# Patient Record
Sex: Female | Born: 1985
Health system: Southern US, Community
[De-identification: ages and names within clinical notes are randomized; demographics above are authoritative.]

## PROBLEM LIST (undated history)

## (undated) DIAGNOSIS — R87629 Unspecified abnormal cytological findings in specimens from vagina: Secondary | ICD-10-CM

## (undated) DIAGNOSIS — M2560 Stiffness of unspecified joint, not elsewhere classified: Secondary | ICD-10-CM

## (undated) DIAGNOSIS — Z86018 Personal history of other benign neoplasm: Secondary | ICD-10-CM

## (undated) DIAGNOSIS — R112 Nausea with vomiting, unspecified: Secondary | ICD-10-CM

## (undated) DIAGNOSIS — Z9889 Other specified postprocedural states: Secondary | ICD-10-CM

## (undated) DIAGNOSIS — M791 Myalgia, unspecified site: Secondary | ICD-10-CM

## (undated) DIAGNOSIS — R202 Paresthesia of skin: Secondary | ICD-10-CM

## (undated) DIAGNOSIS — K219 Gastro-esophageal reflux disease without esophagitis: Secondary | ICD-10-CM

## (undated) DIAGNOSIS — N200 Calculus of kidney: Secondary | ICD-10-CM

## (undated) HISTORY — DX: Personal history of other benign neoplasm: Z86.018

## (undated) HISTORY — DX: Paresthesia of skin: R20.2

## (undated) HISTORY — PX: LEEP: SHX91

## (undated) HISTORY — PX: WISDOM TOOTH EXTRACTION: SHX21

## (undated) HISTORY — DX: Stiffness of unspecified joint, not elsewhere classified: M25.60

## (undated) HISTORY — DX: Calculus of kidney: N20.0

## (undated) HISTORY — DX: Gastro-esophageal reflux disease without esophagitis: K21.9

## (undated) HISTORY — DX: Myalgia, unspecified site: M79.10

---

## 2007-05-09 ENCOUNTER — Encounter: Admission: RE | Admit: 2007-05-09 | Discharge: 2007-05-09 | Payer: Self-pay | Admitting: Internal Medicine

## 2008-09-07 ENCOUNTER — Emergency Department (HOSPITAL_COMMUNITY): Admission: EM | Admit: 2008-09-07 | Discharge: 2008-09-07 | Payer: Self-pay | Admitting: Emergency Medicine

## 2010-07-27 LAB — COMPREHENSIVE METABOLIC PANEL
ALT: 16 U/L (ref 0–35)
AST: 22 U/L (ref 0–37)
Albumin: 3.8 g/dL (ref 3.5–5.2)
CO2: 25 mEq/L (ref 19–32)
Calcium: 8.7 mg/dL (ref 8.4–10.5)
GFR calc Af Amer: >60 mL/min (ref >60)
Sodium: 137 mEq/L (ref 135–145)
Total Protein: 7.4 g/dL (ref 6.0–8.3)

## 2010-07-27 LAB — DIFFERENTIAL
Eosinophils Absolute: 0.2 10*3/uL (ref 0.0–0.7)
Eosinophils Relative: 1 % (ref 0–5)
Lymphocytes Relative: 8 % — ABNORMAL LOW (ref 12–46)
Lymphs Abs: 0.9 10*3/uL (ref 0.7–4.0)
Monocytes Absolute: 0.6 10*3/uL (ref 0.1–1.0)
Monocytes Relative: 5 % (ref 3–12)

## 2010-07-27 LAB — URINALYSIS, ROUTINE W REFLEX MICROSCOPIC
Ketones, ur: NEGATIVE mg/dL
Nitrite: NEGATIVE
Specific Gravity, Urine: 1.025 (ref 1.005–1.030)
Urobilinogen, UA: 0.2 mg/dL (ref 0.0–1.0)
pH: 5 (ref 5.0–8.0)

## 2010-07-27 LAB — CBC
MCHC: 34.2 g/dL (ref 30.0–36.0)
Platelets: 217 10*3/uL (ref 150–400)
RBC: 4.31 MIL/uL (ref 3.87–5.11)
RDW: 13.6 % (ref 11.5–15.5)

## 2016-07-18 DIAGNOSIS — Z30431 Encounter for routine checking of intrauterine contraceptive device: Secondary | ICD-10-CM | POA: Diagnosis not present

## 2016-07-18 DIAGNOSIS — N938 Other specified abnormal uterine and vaginal bleeding: Secondary | ICD-10-CM | POA: Diagnosis not present

## 2016-11-14 DIAGNOSIS — K080 Exfoliation of teeth due to systemic causes: Secondary | ICD-10-CM | POA: Diagnosis not present

## 2017-02-15 ENCOUNTER — Encounter: Payer: Self-pay | Admitting: Family Medicine

## 2017-02-27 DIAGNOSIS — Z30432 Encounter for removal of intrauterine contraceptive device: Secondary | ICD-10-CM | POA: Diagnosis not present

## 2017-03-05 DIAGNOSIS — R300 Dysuria: Secondary | ICD-10-CM | POA: Diagnosis not present

## 2017-03-05 DIAGNOSIS — N202 Calculus of kidney with calculus of ureter: Secondary | ICD-10-CM | POA: Diagnosis not present

## 2017-03-05 DIAGNOSIS — N390 Urinary tract infection, site not specified: Secondary | ICD-10-CM | POA: Diagnosis not present

## 2017-03-15 DIAGNOSIS — N1330 Unspecified hydronephrosis: Secondary | ICD-10-CM | POA: Diagnosis not present

## 2017-03-15 DIAGNOSIS — N200 Calculus of kidney: Secondary | ICD-10-CM | POA: Diagnosis not present

## 2017-03-15 DIAGNOSIS — R310 Gross hematuria: Secondary | ICD-10-CM | POA: Diagnosis not present

## 2017-03-15 DIAGNOSIS — R300 Dysuria: Secondary | ICD-10-CM | POA: Diagnosis not present

## 2017-03-22 DIAGNOSIS — J029 Acute pharyngitis, unspecified: Secondary | ICD-10-CM | POA: Diagnosis not present

## 2017-03-22 DIAGNOSIS — J020 Streptococcal pharyngitis: Secondary | ICD-10-CM | POA: Diagnosis not present

## 2017-03-29 DIAGNOSIS — N1330 Unspecified hydronephrosis: Secondary | ICD-10-CM | POA: Diagnosis not present

## 2017-03-29 DIAGNOSIS — N200 Calculus of kidney: Secondary | ICD-10-CM | POA: Diagnosis not present

## 2017-03-29 DIAGNOSIS — R3129 Other microscopic hematuria: Secondary | ICD-10-CM | POA: Diagnosis not present

## 2017-04-27 DIAGNOSIS — M2560 Stiffness of unspecified joint, not elsewhere classified: Secondary | ICD-10-CM | POA: Diagnosis not present

## 2017-04-27 DIAGNOSIS — N200 Calculus of kidney: Secondary | ICD-10-CM

## 2017-04-27 DIAGNOSIS — Z86018 Personal history of other benign neoplasm: Secondary | ICD-10-CM

## 2017-04-27 DIAGNOSIS — K219 Gastro-esophageal reflux disease without esophagitis: Secondary | ICD-10-CM

## 2017-04-27 DIAGNOSIS — R202 Paresthesia of skin: Secondary | ICD-10-CM

## 2017-04-27 DIAGNOSIS — M791 Myalgia, unspecified site: Secondary | ICD-10-CM | POA: Diagnosis not present

## 2017-04-27 HISTORY — DX: Gastro-esophageal reflux disease without esophagitis: K21.9

## 2017-04-27 HISTORY — DX: Myalgia, unspecified site: M79.10

## 2017-04-27 HISTORY — DX: Calculus of kidney: N20.0

## 2017-04-27 HISTORY — DX: Personal history of other benign neoplasm: Z86.018

## 2017-04-27 HISTORY — DX: Stiffness of unspecified joint, not elsewhere classified: M25.60

## 2017-04-27 HISTORY — DX: Paresthesia of skin: R20.2

## 2017-04-28 ENCOUNTER — Ambulatory Visit: Payer: Self-pay

## 2017-04-28 DIAGNOSIS — N3090 Cystitis, unspecified without hematuria: Secondary | ICD-10-CM | POA: Diagnosis not present

## 2017-05-01 ENCOUNTER — Other Ambulatory Visit: Payer: Self-pay

## 2017-05-02 ENCOUNTER — Ambulatory Visit: Payer: Self-pay | Admitting: Urology

## 2017-05-09 DIAGNOSIS — R319 Hematuria, unspecified: Secondary | ICD-10-CM | POA: Diagnosis not present

## 2017-06-01 DIAGNOSIS — Z6823 Body mass index (BMI) 23.0-23.9, adult: Secondary | ICD-10-CM | POA: Diagnosis not present

## 2017-06-01 DIAGNOSIS — Z118 Encounter for screening for other infectious and parasitic diseases: Secondary | ICD-10-CM | POA: Diagnosis not present

## 2017-06-01 DIAGNOSIS — Z113 Encounter for screening for infections with a predominantly sexual mode of transmission: Secondary | ICD-10-CM | POA: Diagnosis not present

## 2017-06-01 DIAGNOSIS — Z01419 Encounter for gynecological examination (general) (routine) without abnormal findings: Secondary | ICD-10-CM | POA: Diagnosis not present

## 2017-06-06 DIAGNOSIS — K080 Exfoliation of teeth due to systemic causes: Secondary | ICD-10-CM | POA: Diagnosis not present

## 2017-06-07 ENCOUNTER — Encounter: Payer: Self-pay | Admitting: Urology

## 2017-06-07 ENCOUNTER — Ambulatory Visit (INDEPENDENT_AMBULATORY_CARE_PROVIDER_SITE_OTHER): Payer: Federal, State, Local not specified - PPO | Admitting: Urology

## 2017-06-07 VITALS — BP 100/65 | HR 69 | Ht 62.0 in | Wt 126.0 lb

## 2017-06-07 DIAGNOSIS — R3129 Other microscopic hematuria: Secondary | ICD-10-CM | POA: Diagnosis not present

## 2017-06-07 DIAGNOSIS — Z87442 Personal history of urinary calculi: Secondary | ICD-10-CM | POA: Diagnosis not present

## 2017-06-07 DIAGNOSIS — N390 Urinary tract infection, site not specified: Secondary | ICD-10-CM | POA: Diagnosis not present

## 2017-06-07 LAB — URINALYSIS, COMPLETE
Bilirubin, UA: NEGATIVE
Glucose, UA: NEGATIVE
Ketones, UA: NEGATIVE
Leukocytes, UA: NEGATIVE
NITRITE UA: NEGATIVE
PH UA: 5.5 (ref 5.0–7.5)
PROTEIN UA: NEGATIVE
Specific Gravity, UA: 1.025 (ref 1.005–1.030)
UUROB: 0.2 mg/dL (ref 0.2–1.0)

## 2017-06-07 LAB — MICROSCOPIC EXAMINATION: WBC UA: NONE SEEN /HPF (ref 0–?)

## 2017-06-07 NOTE — Progress Notes (Signed)
06/07/2017 1:57 PM   Nicole Mcgrath 25-Nov-1985 [id number]  Referring provider: Ace Gins, MD [address] [place], [place] [address]  Chief Complaint  Patient presents with  . Hematuria    New Patient  . Recurrent UTI    HPI: 32 year old female with recurrent urinary tract infections, microscopic hematuria who presents today to establish care.  She moved to this area about 6 months ago from [place] where she was previously followed by a urologist, Ace Gins.  She has been commuting back and forth but now is seeking to have care locally.  She is a personal history of nephrolithiasis.  She had an episode of flank pain resulting in a noncontrast CT scan on 03/15/2017 at [facility].  This showed mildly thickened bladder wall at 6 mm but no evidence of hydronephrosis or obstructing stones bilaterally.   Previous stone analysis revealed calcium oxalate monohydrate 98%, calcium phosphate 2%.  She does also a personal history of microscopic hematuria.  A CT urogram as well as cystoscopy was ordered by her previous urologist.  She is a never smoker and has no chemical exposures.  She is unsure whether she was on her menstrual cycle at the time of previous urinalysis.  She has a personal history of urinary tract infections, primarily during pregnancy.  She is not currently pregnant.  Today, she is currently asymptomatic.  She has no flank pain.  No dysuria or urinary symptoms.   PMH: Past Medical History:  Diagnosis Date  . Gastroesophageal reflux disease 04/27/2017  . History of atypical skin mole 04/27/2017  . Kidney stones 04/27/2017  . Myalgia 04/27/2017  . Paresthesia 04/27/2017  . Stiffness of joint 04/27/2017    Surgical History: Past Surgical History:  Procedure Laterality Date  . CESAREAN SECTION  2017    Home Medications:  Allergies as of 06/07/2017   No Known Allergies     Medication List        Accurate as of 06/07/17 11:59 PM. Always use your most recent med  list.          prenatal vitamin w/FE, FA 27-1 MG Tabs tablet Take 1 tablet by mouth daily at 12 noon.       Allergies: No Known Allergies  Family History: Family History  Problem Relation Age of Onset  . Prostate cancer Neg Hx   . Bladder Cancer Neg Hx   . Kidney cancer Neg Hx     Social History:  reports that she has never smoked. She has never used smokeless tobacco. She reports that she drinks alcohol. She reports that she does not use drugs.  ROS: UROLOGY Frequent Urination?: Yes Hard to postpone urination?: Yes Burning/pain with urination?: Yes Get up at night to urinate?: No Leakage of urine?: No Urine stream starts and stops?: No Trouble starting stream?: No Do you have to strain to urinate?: No Blood in urine?: Yes Urinary tract infection?: Yes Sexually transmitted disease?: No Injury to kidneys or bladder?: No Painful intercourse?: No Weak stream?: No Currently pregnant?: No Vaginal bleeding?: No Last menstrual period?: n  Gastrointestinal Nausea?: No Vomiting?: No Indigestion/heartburn?: Yes Diarrhea?: No Constipation?: No  Constitutional Fever: No Night sweats?: No Weight loss?: No Fatigue?: No  Skin Skin rash/lesions?: No Itching?: No  Eyes Blurred vision?: No Double vision?: No  Ears/Nose/Throat Sore throat?: No Sinus problems?: No  Hematologic/Lymphatic Swollen glands?: No Easy bruising?: No  Cardiovascular Leg swelling?: No Chest pain?: No  Respiratory Cough?: No Shortness of breath?: No  Endocrine  Excessive thirst?: No  Musculoskeletal Back pain?: No Joint pain?: No  Neurological Headaches?: No Dizziness?: Yes  Psychologic Depression?: No Anxiety?: No  Physical Exam: BP 100/65   Pulse 69   Ht 5\' 2"  (1.575 m)   Wt 126 lb (57.2 kg)   LMP 05/16/2017 (Approximate)   BMI 23.05 kg/m   Constitutional:  Alert and oriented, No acute distress. HEENT: [place] AT, moist mucus membranes.  Trachea midline, no  masses. Cardiovascular: No clubbing, cyanosis, or edema. Respiratory: Normal respiratory effort, no increased work of breathing. GI: Abdomen is soft, nontender, nondistended, no abdominal masses GU: No CVA tenderness.  Skin: No rashes, bruises or suspicious lesions. Neurologic: Grossly intact, no focal deficits, moving all 4 extremities. Psychiatric: Normal mood and affect.  Laboratory Data: Lab Results  Component Value Date   WBC 11.4 (H) 09/07/2008   HGB 13.5 09/07/2008   HCT 39.5 09/07/2008   MCV 91.7 09/07/2008   PLT 217 09/07/2008    Lab Results  Component Value Date   CREATININE 0.79 09/07/2008    Urinalysis Lab Results  Component Value Date   SPECGRAV 1.025 06/07/2017   PHUR 5.5 06/07/2017   COLORU Yellow 06/07/2017   APPEARANCEUR Clear 06/07/2017   LEUKOCYTESUR Negative 06/07/2017   PROTEINUR Negative 06/07/2017   GLUCOSEU Negative 06/07/2017   KETONESU Negative 06/07/2017   RBCU 3+ (A) 06/07/2017   BILIRUBINUR Negative 06/07/2017   UUROB 0.2 06/07/2017   NITRITE Negative 06/07/2017    Lab Results  Component Value Date   LABMICR See below: 06/07/2017   WBCUA None seen 06/07/2017   RBCUA 3-10 (A) 06/07/2017   LABEPIT 0-10 06/07/2017   MUCUS Present (A) 06/07/2017   BACTERIA Few (A) 06/07/2017    Pertinent Imaging: Unable to personally review previous imaging, reports reviewed from CT scan from 03/07/2017 at which time she was stone free  Assessment & Plan:    1. Microscopic hematuria We discussed the differential diagnosis for microscopic hematuria including nephrolithiasis, renal or upper tract tumors, bladder stones, UTIs, or bladder tumors as well as undetermined etiologies.  Per AUA guidelines, patients under the age of 32 without risk factors could be considered but do not necessarily need to undergo cystoscopy.  We discussed the risk and benefits of whether or not to proceed with this.  She does have microscopic blood in her urine today.  We  will consider cystoscopy in the future if this persists.   - Urinalysis, Complete  2. History of kidney stones Discussed stone prevention techniques in detail Currently stone free as of 02/2017 on cross-sectional imaging KUB at next visit for baseline - DG Abd 1 View; Future  3. Recurrent UTI Review of signs and symptoms of urinary tract infections  If she does develop signs or symptoms of UTI, advised to call our office for same-day nurse visit to evaluate UA/urine culture - CULTURE, URINE COMPREHENSIVE   Return in about 3 months (around 09/04/2017) for UA/ KUB.  Vanna ScotlandAshley Pepper Wyndham, MD  [facility] [address], [address] [place], [place] [id number] [phone]

## 2017-06-09 LAB — CULTURE, URINE COMPREHENSIVE

## 2017-06-13 ENCOUNTER — Telehealth: Payer: Self-pay

## 2017-06-13 NOTE — Telephone Encounter (Signed)
Letter sent.

## 2017-06-13 NOTE — Telephone Encounter (Signed)
-----   Message from Vanna ScotlandAshley Brandon, MD sent at 06/09/2017  5:19 PM EST ----- Please let this patient know that we did send her urine for culture again today and it was negative.

## 2017-07-17 ENCOUNTER — Encounter: Payer: Self-pay | Admitting: Urology

## 2017-07-19 DIAGNOSIS — D2272 Melanocytic nevi of left lower limb, including hip: Secondary | ICD-10-CM | POA: Diagnosis not present

## 2017-07-19 DIAGNOSIS — L718 Other rosacea: Secondary | ICD-10-CM | POA: Diagnosis not present

## 2017-07-19 DIAGNOSIS — D2261 Melanocytic nevi of right upper limb, including shoulder: Secondary | ICD-10-CM | POA: Diagnosis not present

## 2017-07-19 DIAGNOSIS — D2262 Melanocytic nevi of left upper limb, including shoulder: Secondary | ICD-10-CM | POA: Diagnosis not present

## 2017-07-26 DIAGNOSIS — R309 Painful micturition, unspecified: Secondary | ICD-10-CM | POA: Diagnosis not present

## 2017-07-26 DIAGNOSIS — N390 Urinary tract infection, site not specified: Secondary | ICD-10-CM | POA: Diagnosis not present

## 2017-07-26 DIAGNOSIS — R319 Hematuria, unspecified: Secondary | ICD-10-CM | POA: Diagnosis not present

## 2017-09-12 ENCOUNTER — Telehealth: Payer: Self-pay | Admitting: Urology

## 2017-09-12 NOTE — Telephone Encounter (Signed)
Pt called office stating she has a 3 mos follow up appt with UA and KUB prior, pt states she is on her cycle and wonders if the appt should be rescheduled due to menstration interfering w/ results of UA. Please advise. Thanks.

## 2017-09-13 ENCOUNTER — Ambulatory Visit
Admission: RE | Admit: 2017-09-13 | Discharge: 2017-09-13 | Disposition: A | Discharge status: Home / Self Care | Payer: Federal, State, Local not specified - PPO | Source: Ambulatory Visit | Attending: Urology | Admitting: Urology

## 2017-09-13 ENCOUNTER — Ambulatory Visit: Payer: Federal, State, Local not specified - PPO | Admitting: Urology

## 2017-09-13 DIAGNOSIS — R3129 Other microscopic hematuria: Secondary | ICD-10-CM

## 2017-09-13 DIAGNOSIS — N200 Calculus of kidney: Secondary | ICD-10-CM | POA: Diagnosis not present

## 2017-09-14 ENCOUNTER — Telehealth: Payer: Self-pay

## 2017-09-14 NOTE — Telephone Encounter (Signed)
-----   Message from Vanna Scotland, MD sent at 09/13/2017  4:41 PM EDT ----- No obvious stones today on x-ray.  I will be on the look out for your next week to assess whether or not you still blood in your urine in order to make a decision whether or not we need to proceed with cystoscopy.

## 2017-09-14 NOTE — Telephone Encounter (Signed)
Called pt, informed her of the information below. Pt gave verbal understanding.  

## 2017-09-19 ENCOUNTER — Other Ambulatory Visit: Payer: Self-pay | Admitting: Family Medicine

## 2017-09-19 DIAGNOSIS — R3129 Other microscopic hematuria: Secondary | ICD-10-CM

## 2017-09-20 ENCOUNTER — Other Ambulatory Visit: Payer: Federal, State, Local not specified - PPO

## 2017-09-20 DIAGNOSIS — R3129 Other microscopic hematuria: Secondary | ICD-10-CM

## 2017-09-20 LAB — URINALYSIS, COMPLETE
Bilirubin, UA: NEGATIVE
GLUCOSE, UA: NEGATIVE
Ketones, UA: NEGATIVE
Leukocytes, UA: NEGATIVE
Nitrite, UA: NEGATIVE
PH UA: 6 (ref 5.0–7.5)
PROTEIN UA: NEGATIVE
Specific Gravity, UA: 1.015 (ref 1.005–1.030)
Urobilinogen, Ur: 0.2 mg/dL (ref 0.2–1.0)

## 2017-09-20 LAB — MICROSCOPIC EXAMINATION: WBC, UA: NONE SEEN /hpf (ref 0–5)

## 2017-09-22 ENCOUNTER — Telehealth: Payer: Self-pay

## 2017-09-22 NOTE — Telephone Encounter (Signed)
-----   Message from Vanna ScotlandAshley Brandon, MD sent at 09/21/2017 10:49 AM EDT ----- Microscopic blood is still present in your urine.  Honestly, it is probably not a bad idea to have an office cystoscopy although I doubt we will find anything.  Please go ahead and schedule this if you are okay with this plan.    Vanna ScotlandAshley Brandon, MD

## 2017-10-04 ENCOUNTER — Ambulatory Visit: Payer: Federal, State, Local not specified - PPO | Admitting: Urology

## 2017-10-04 ENCOUNTER — Encounter: Payer: Self-pay | Admitting: Urology

## 2017-10-04 VITALS — BP 112/71 | HR 60 | Resp 16 | Ht 62.0 in | Wt 127.2 lb

## 2017-10-04 DIAGNOSIS — R3129 Other microscopic hematuria: Secondary | ICD-10-CM

## 2017-10-04 DIAGNOSIS — Z87442 Personal history of urinary calculi: Secondary | ICD-10-CM

## 2017-10-04 LAB — URINALYSIS, COMPLETE
BILIRUBIN UA: NEGATIVE
Glucose, UA: NEGATIVE
Ketones, UA: NEGATIVE
Leukocytes, UA: NEGATIVE
NITRITE UA: NEGATIVE
Protein, UA: NEGATIVE
Specific Gravity, UA: <1.005 — ABNORMAL LOW (ref 1.005–1.030)
Urobilinogen, Ur: 0.2 mg/dL (ref 0.2–1.0)
pH, UA: 6 (ref 5.0–7.5)

## 2017-10-04 LAB — MICROSCOPIC EXAMINATION
Bacteria, UA: NONE SEEN
WBC, UA: NONE SEEN /hpf (ref 0–5)

## 2017-10-04 MED ORDER — CIPROFLOXACIN HCL 500 MG PO TABS
500.0000 mg | ORAL_TABLET | Freq: Once | ORAL | Status: AC
  Administered 2017-10-04: 500 mg via ORAL

## 2017-10-04 MED ORDER — LIDOCAINE HCL URETHRAL/MUCOSAL 2 % EX GEL
1.0000 "application " | Freq: Once | CUTANEOUS | Status: AC
  Administered 2017-10-04: 1 via URETHRAL

## 2017-10-04 NOTE — Progress Notes (Signed)
   10/04/17  CC:  Chief Complaint  Patient presents with  . Cysto    HPI: 32 year old female with a history of recurrent urinary tract infections, history of kidney stones, and microscopic hematuria who presents today for office cystoscopy.  Most recent imaging in the form of KUB shows no obvious stone burden.  Most recent cross-sectional imaging in the form of CT scan was 02/2017 with a noncontrast scan for stones.  See previous note for details.  Blood pressure 112/71, pulse 60, resp. rate 16, height 5\' 2"  (1.575 m), weight 127 lb 3.2 oz (57.7 kg), SpO2 96 %. NED. A&Ox3.   No respiratory distress   Abd soft, NT, ND Normal external genitalia with patent urethral meatus  Cystoscopy Procedure Note  Patient identification was confirmed, informed consent was obtained, and patient was prepped using Betadine solution.  Lidocaine jelly was administered per urethral meatus.    Preoperative abx where received prior to procedure.    Procedure: - Flexible cystoscope introduced, without any difficulty.   - Thorough search of the bladder revealed:    normal urethral meatus    normal urothelium    no stones    no ulcers     no tumors    no urethral polyps    no trabeculation  - Ureteral orifices were normal in position and appearance.  Post-Procedure: - Patient tolerated the procedure well  Assessment/ Plan:  1. Microscopic hematuria Possibly related to stones Given her age and very low risk, no need for additional CT urogram at this point in time Recent noncontrast CT scan shows no obvious renal abnormalities or masses Cystoscopy today is unremarkable - Urinalysis, Complete - ciprofloxacin (CIPRO) tablet 500 mg - lidocaine (XYLOCAINE) 2 % jelly 1 application  2. History of kidney stones Most recent KUB without stones Currently asymptomatic  Follow-up as needed  Vanna ScotlandAshley Aubryana Vittorio, MD

## 2017-12-06 DIAGNOSIS — Z3201 Encounter for pregnancy test, result positive: Secondary | ICD-10-CM | POA: Diagnosis not present

## 2017-12-27 DIAGNOSIS — Z3201 Encounter for pregnancy test, result positive: Secondary | ICD-10-CM | POA: Diagnosis not present

## 2018-01-01 DIAGNOSIS — K080 Exfoliation of teeth due to systemic causes: Secondary | ICD-10-CM | POA: Diagnosis not present

## 2018-01-19 DIAGNOSIS — Z3689 Encounter for other specified antenatal screening: Secondary | ICD-10-CM | POA: Diagnosis not present

## 2018-01-19 DIAGNOSIS — Z01419 Encounter for gynecological examination (general) (routine) without abnormal findings: Secondary | ICD-10-CM | POA: Diagnosis not present

## 2018-01-19 DIAGNOSIS — Z23 Encounter for immunization: Secondary | ICD-10-CM | POA: Diagnosis not present

## 2018-01-19 DIAGNOSIS — O3441 Maternal care for other abnormalities of cervix, first trimester: Secondary | ICD-10-CM | POA: Diagnosis not present

## 2018-01-19 DIAGNOSIS — Z118 Encounter for screening for other infectious and parasitic diseases: Secondary | ICD-10-CM | POA: Diagnosis not present

## 2018-01-19 DIAGNOSIS — Z3481 Encounter for supervision of other normal pregnancy, first trimester: Secondary | ICD-10-CM | POA: Diagnosis not present

## 2018-01-30 DIAGNOSIS — K080 Exfoliation of teeth due to systemic causes: Secondary | ICD-10-CM | POA: Diagnosis not present

## 2018-02-16 DIAGNOSIS — Z3A14 14 weeks gestation of pregnancy: Secondary | ICD-10-CM | POA: Diagnosis not present

## 2018-02-16 DIAGNOSIS — O3441 Maternal care for other abnormalities of cervix, first trimester: Secondary | ICD-10-CM | POA: Diagnosis not present

## 2018-03-02 DIAGNOSIS — Z3A16 16 weeks gestation of pregnancy: Secondary | ICD-10-CM | POA: Diagnosis not present

## 2018-03-02 DIAGNOSIS — O3442 Maternal care for other abnormalities of cervix, second trimester: Secondary | ICD-10-CM | POA: Diagnosis not present

## 2018-03-02 DIAGNOSIS — Z361 Encounter for antenatal screening for raised alphafetoprotein level: Secondary | ICD-10-CM | POA: Diagnosis not present

## 2018-03-19 DIAGNOSIS — Z363 Encounter for antenatal screening for malformations: Secondary | ICD-10-CM | POA: Diagnosis not present

## 2018-03-19 DIAGNOSIS — Z3A18 18 weeks gestation of pregnancy: Secondary | ICD-10-CM | POA: Diagnosis not present

## 2018-03-19 DIAGNOSIS — O3442 Maternal care for other abnormalities of cervix, second trimester: Secondary | ICD-10-CM | POA: Diagnosis not present

## 2018-04-23 DIAGNOSIS — Z3A23 23 weeks gestation of pregnancy: Secondary | ICD-10-CM | POA: Diagnosis not present

## 2018-04-23 DIAGNOSIS — O3442 Maternal care for other abnormalities of cervix, second trimester: Secondary | ICD-10-CM | POA: Diagnosis not present

## 2018-05-14 DIAGNOSIS — O3442 Maternal care for other abnormalities of cervix, second trimester: Secondary | ICD-10-CM | POA: Diagnosis not present

## 2018-05-14 DIAGNOSIS — Z3689 Encounter for other specified antenatal screening: Secondary | ICD-10-CM | POA: Diagnosis not present

## 2018-05-14 DIAGNOSIS — Z3A26 26 weeks gestation of pregnancy: Secondary | ICD-10-CM | POA: Diagnosis not present

## 2018-05-29 DIAGNOSIS — Z3A29 29 weeks gestation of pregnancy: Secondary | ICD-10-CM | POA: Diagnosis not present

## 2018-05-29 DIAGNOSIS — Z23 Encounter for immunization: Secondary | ICD-10-CM | POA: Diagnosis not present

## 2018-05-29 DIAGNOSIS — O3443 Maternal care for other abnormalities of cervix, third trimester: Secondary | ICD-10-CM | POA: Diagnosis not present

## 2018-06-13 DIAGNOSIS — Z3403 Encounter for supervision of normal first pregnancy, third trimester: Secondary | ICD-10-CM | POA: Diagnosis not present

## 2018-06-25 DIAGNOSIS — Z3A32 32 weeks gestation of pregnancy: Secondary | ICD-10-CM | POA: Diagnosis not present

## 2018-06-25 DIAGNOSIS — O3443 Maternal care for other abnormalities of cervix, third trimester: Secondary | ICD-10-CM | POA: Diagnosis not present

## 2018-07-06 ENCOUNTER — Other Ambulatory Visit: Payer: Self-pay | Admitting: Obstetrics

## 2018-07-11 DIAGNOSIS — Z3A35 35 weeks gestation of pregnancy: Secondary | ICD-10-CM | POA: Diagnosis not present

## 2018-07-11 DIAGNOSIS — Z3685 Encounter for antenatal screening for Streptococcus B: Secondary | ICD-10-CM | POA: Diagnosis not present

## 2018-07-11 DIAGNOSIS — O3443 Maternal care for other abnormalities of cervix, third trimester: Secondary | ICD-10-CM | POA: Diagnosis not present

## 2018-07-23 DIAGNOSIS — O3443 Maternal care for other abnormalities of cervix, third trimester: Secondary | ICD-10-CM | POA: Diagnosis not present

## 2018-07-23 DIAGNOSIS — Z3A36 36 weeks gestation of pregnancy: Secondary | ICD-10-CM | POA: Diagnosis not present

## 2018-07-24 ENCOUNTER — Encounter (HOSPITAL_COMMUNITY): Payer: Self-pay | Admitting: *Deleted

## 2018-07-24 NOTE — Patient Instructions (Addendum)
Nicole Mcgrath  07/24/2018   Your procedure is scheduled on:  08/07/2018  Arrive at 0730 at [facility] on [address] at [facility]  and [employer]. You are invited to use the FREE valet parking or use the Visitor's parking deck.  Pick up the phone at the desk and dial [phone].  Call this number if you have problems the morning of surgery: [phone]  Remember:   Do not eat food:(After Midnight) Desps de medianoche.  Do not drink clear liquids: (After Midnight) Desps de medianoche.  Take these medicines the morning of surgery with A SIP OF WATER:  none   Do not wear jewelry, make-up or nail polish.  Do not wear lotions, powders, or perfumes. Do not wear deodorant.  Do not shave 48 hours prior to surgery.  Do not bring valuables to the hospital.  [facility] is not   responsible for any belongings or valuables brought to the hospital.  Contacts, dentures or bridgework may not be worn into surgery.  Leave suitcase in the car. After surgery it may be brought to your room.  For patients admitted to the hospital, checkout time is 11:00 AM the day of              discharge.      Please read over the following fact sheets that you were given:     Preparing for Surgery

## 2018-08-02 DIAGNOSIS — Z3A38 38 weeks gestation of pregnancy: Secondary | ICD-10-CM | POA: Diagnosis not present

## 2018-08-02 DIAGNOSIS — O3443 Maternal care for other abnormalities of cervix, third trimester: Secondary | ICD-10-CM | POA: Diagnosis not present

## 2018-08-06 ENCOUNTER — Encounter (HOSPITAL_COMMUNITY)
Admission: RE | Admit: 2018-08-06 | Discharge: 2018-08-06 | Disposition: A | Discharge status: Home / Self Care | Payer: Federal, State, Local not specified - PPO | Source: Ambulatory Visit

## 2018-08-06 HISTORY — DX: Unspecified abnormal cytological findings in specimens from vagina: R87.629

## 2018-08-06 HISTORY — DX: Other specified postprocedural states: Z98.890

## 2018-08-06 HISTORY — DX: Nausea with vomiting, unspecified: R11.2

## 2018-08-07 ENCOUNTER — Inpatient Hospital Stay (HOSPITAL_COMMUNITY): Payer: Federal, State, Local not specified - PPO | Admitting: Certified Registered Nurse Anesthetist

## 2018-08-07 ENCOUNTER — Encounter (HOSPITAL_COMMUNITY): Payer: Self-pay | Admitting: Anesthesiology

## 2018-08-07 ENCOUNTER — Inpatient Hospital Stay (HOSPITAL_COMMUNITY)
Admission: AD | Admit: 2018-08-07 | Discharge: 2018-08-09 | DRG: 787 | Disposition: A | Discharge status: Home / Self Care | Payer: Federal, State, Local not specified - PPO | Attending: Obstetrics | Admitting: Obstetrics

## 2018-08-07 ENCOUNTER — Encounter (HOSPITAL_COMMUNITY)
Admission: AD | Disposition: A | Discharge status: Home / Self Care | Payer: Self-pay | Source: Home / Self Care | Attending: Obstetrics

## 2018-08-07 DIAGNOSIS — O34219 Maternal care for unspecified type scar from previous cesarean delivery: Secondary | ICD-10-CM | POA: Diagnosis present

## 2018-08-07 DIAGNOSIS — O26893 Other specified pregnancy related conditions, third trimester: Secondary | ICD-10-CM | POA: Diagnosis present

## 2018-08-07 DIAGNOSIS — Z3A39 39 weeks gestation of pregnancy: Secondary | ICD-10-CM

## 2018-08-07 DIAGNOSIS — O34211 Maternal care for low transverse scar from previous cesarean delivery: Secondary | ICD-10-CM | POA: Diagnosis not present

## 2018-08-07 DIAGNOSIS — O9962 Diseases of the digestive system complicating childbirth: Secondary | ICD-10-CM | POA: Diagnosis not present

## 2018-08-07 DIAGNOSIS — O9081 Anemia of the puerperium: Secondary | ICD-10-CM | POA: Diagnosis not present

## 2018-08-07 DIAGNOSIS — K219 Gastro-esophageal reflux disease without esophagitis: Secondary | ICD-10-CM | POA: Diagnosis not present

## 2018-08-07 DIAGNOSIS — D62 Acute posthemorrhagic anemia: Secondary | ICD-10-CM | POA: Diagnosis not present

## 2018-08-07 DIAGNOSIS — Z98891 History of uterine scar from previous surgery: Secondary | ICD-10-CM

## 2018-08-07 LAB — TYPE AND SCREEN
ABO/RH(D): O POS
Antibody Screen: NEGATIVE

## 2018-08-07 LAB — RPR: RPR Ser Ql: NONREACTIVE

## 2018-08-07 LAB — CBC
HCT: 37.3 % (ref 36.0–46.0)
Hemoglobin: 12.5 g/dL (ref 12.0–15.0)
MCH: 31 pg (ref 26.0–34.0)
MCHC: 33.5 g/dL (ref 30.0–36.0)
MCV: 92.6 fL (ref 80.0–100.0)
Platelets: 221 10*3/uL (ref 150–400)
RBC: 4.03 MIL/uL (ref 3.87–5.11)
RDW: 13.9 % (ref 11.5–15.5)
WBC: 11.9 10*3/uL — ABNORMAL HIGH (ref 4.0–10.5)
nRBC: 0 % (ref 0.0–0.2)

## 2018-08-07 LAB — ABO/RH: ABO/RH(D): O POS

## 2018-08-07 SURGERY — Surgical Case
Anesthesia: Spinal | Wound class: Clean Contaminated

## 2018-08-07 MED ORDER — OXYCODONE-ACETAMINOPHEN 5-325 MG PO TABS
1.0000 | ORAL_TABLET | ORAL | Status: DC | PRN
  Administered 2018-08-08 – 2018-08-09 (×6): 1 via ORAL
  Filled 2018-08-07 (×6): qty 1

## 2018-08-07 MED ORDER — SCOPOLAMINE 1 MG/3DAYS TD PT72: 1.0000 | MEDICATED_PATCH | Freq: Once | TRANSDERMAL | Status: DC

## 2018-08-07 MED ORDER — CEFAZOLIN SODIUM-DEXTROSE 2-4 GM/100ML-% IV SOLN
INTRAVENOUS | Status: AC
  Filled 2018-08-07: qty 100

## 2018-08-07 MED ORDER — PHENYLEPHRINE HCL-NACL 20-0.9 MG/250ML-% IV SOLN
INTRAVENOUS | Status: DC | PRN
  Administered 2018-08-07: 60 ug/min via INTRAVENOUS

## 2018-08-07 MED ORDER — BUPIVACAINE IN DEXTROSE 0.75-8.25 % IT SOLN
INTRATHECAL | Status: DC | PRN
  Administered 2018-08-07: 1.6 mL via INTRATHECAL

## 2018-08-07 MED ORDER — DIBUCAINE (PERIANAL) 1 % EX OINT: 1.0000 "application " | TOPICAL_OINTMENT | CUTANEOUS | Status: DC | PRN

## 2018-08-07 MED ORDER — SCOPOLAMINE 1 MG/3DAYS TD PT72
MEDICATED_PATCH | TRANSDERMAL | Status: DC | PRN
  Administered 2018-08-07: 1 via TRANSDERMAL

## 2018-08-07 MED ORDER — DIPHENHYDRAMINE HCL 50 MG/ML IJ SOLN
12.5000 mg | INTRAMUSCULAR | Status: DC | PRN
  Administered 2018-08-07: 12.5 mg via INTRAVENOUS

## 2018-08-07 MED ORDER — IBUPROFEN 800 MG PO TABS
800.0000 mg | ORAL_TABLET | Freq: Three times a day (TID) | ORAL | Status: DC
  Administered 2018-08-07 – 2018-08-09 (×7): 800 mg via ORAL
  Filled 2018-08-07 (×7): qty 1

## 2018-08-07 MED ORDER — TETANUS-DIPHTH-ACELL PERTUSSIS 5-2.5-18.5 LF-MCG/0.5 IM SUSP: 0.5000 mL | Freq: Once | INTRAMUSCULAR | Status: DC

## 2018-08-07 MED ORDER — SENNOSIDES-DOCUSATE SODIUM 8.6-50 MG PO TABS
2.0000 | ORAL_TABLET | ORAL | Status: DC
  Administered 2018-08-07 – 2018-08-09 (×2): 2 via ORAL
  Filled 2018-08-07 (×2): qty 2

## 2018-08-07 MED ORDER — FAMOTIDINE 20 MG PO TABS
20.0000 mg | ORAL_TABLET | Freq: Two times a day (BID) | ORAL | Status: DC
  Administered 2018-08-07 – 2018-08-09 (×4): 20 mg via ORAL
  Filled 2018-08-07 (×5): qty 1

## 2018-08-07 MED ORDER — ONDANSETRON HCL 4 MG/2ML IJ SOLN
INTRAMUSCULAR | Status: DC | PRN
  Administered 2018-08-07: 4 mg via INTRAVENOUS

## 2018-08-07 MED ORDER — OXYCODONE HCL 5 MG/5ML PO SOLN: 5.0000 mg | Freq: Once | ORAL | Status: DC | PRN

## 2018-08-07 MED ORDER — DIPHENHYDRAMINE HCL 50 MG/ML IJ SOLN
INTRAMUSCULAR | Status: AC
  Filled 2018-08-07: qty 1

## 2018-08-07 MED ORDER — MORPHINE SULFATE (PF) 0.5 MG/ML IJ SOLN
INTRAMUSCULAR | Status: DC | PRN
  Administered 2018-08-07: 150 ug via INTRATHECAL

## 2018-08-07 MED ORDER — LACTATED RINGERS IV SOLN
INTRAVENOUS | Status: DC
  Administered 2018-08-07: 19:00:00 via INTRAVENOUS

## 2018-08-07 MED ORDER — DEXAMETHASONE SODIUM PHOSPHATE 4 MG/ML IJ SOLN
INTRAMUSCULAR | Status: AC
  Filled 2018-08-07: qty 8

## 2018-08-07 MED ORDER — LACTATED RINGERS IV SOLN
INTRAVENOUS | Status: DC
  Administered 2018-08-07 (×2): via INTRAVENOUS

## 2018-08-07 MED ORDER — COCONUT OIL OIL
1.0000 "application " | TOPICAL_OIL | Status: DC | PRN
  Administered 2018-08-08: 1 via TOPICAL

## 2018-08-07 MED ORDER — MENTHOL 3 MG MT LOZG: 1.0000 | LOZENGE | OROMUCOSAL | Status: DC | PRN

## 2018-08-07 MED ORDER — SODIUM CHLORIDE 0.9% FLUSH: 3.0000 mL | INTRAVENOUS | Status: DC | PRN

## 2018-08-07 MED ORDER — ONDANSETRON HCL 4 MG/2ML IJ SOLN: 4.0000 mg | Freq: Three times a day (TID) | INTRAMUSCULAR | Status: DC | PRN

## 2018-08-07 MED ORDER — MORPHINE SULFATE (PF) 0.5 MG/ML IJ SOLN
INTRAMUSCULAR | Status: AC
  Filled 2018-08-07: qty 10

## 2018-08-07 MED ORDER — PHENYLEPHRINE 40 MCG/ML (10ML) SYRINGE FOR IV PUSH (FOR BLOOD PRESSURE SUPPORT)
PREFILLED_SYRINGE | INTRAVENOUS | Status: AC
  Filled 2018-08-07: qty 20

## 2018-08-07 MED ORDER — OXYCODONE HCL 5 MG PO TABS: 5.0000 mg | ORAL_TABLET | Freq: Once | ORAL | Status: DC | PRN

## 2018-08-07 MED ORDER — SODIUM CHLORIDE 0.9 % IV SOLN
INTRAVENOUS | Status: DC | PRN
  Administered 2018-08-07: 10:00:00 via INTRAVENOUS

## 2018-08-07 MED ORDER — DEXAMETHASONE SODIUM PHOSPHATE 4 MG/ML IJ SOLN
INTRAMUSCULAR | Status: DC | PRN
  Administered 2018-08-07: 4 mg via INTRAVENOUS

## 2018-08-07 MED ORDER — FENTANYL CITRATE (PF) 100 MCG/2ML IJ SOLN
INTRAMUSCULAR | Status: DC | PRN
  Administered 2018-08-07: 15 ug via INTRATHECAL

## 2018-08-07 MED ORDER — NALBUPHINE HCL 10 MG/ML IJ SOLN: 5.0000 mg | Freq: Once | INTRAMUSCULAR | Status: DC | PRN

## 2018-08-07 MED ORDER — PRENATAL MULTIVITAMIN CH
1.0000 | ORAL_TABLET | Freq: Every day | ORAL | Status: DC
  Administered 2018-08-08 – 2018-08-09 (×2): 1 via ORAL
  Filled 2018-08-07 (×2): qty 1

## 2018-08-07 MED ORDER — NALBUPHINE HCL 10 MG/ML IJ SOLN: 5.0000 mg | INTRAMUSCULAR | Status: DC | PRN

## 2018-08-07 MED ORDER — ZOLPIDEM TARTRATE 5 MG PO TABS: 5.0000 mg | ORAL_TABLET | Freq: Every evening | ORAL | Status: DC | PRN

## 2018-08-07 MED ORDER — SIMETHICONE 80 MG PO CHEW
80.0000 mg | CHEWABLE_TABLET | ORAL | Status: DC
  Administered 2018-08-07 – 2018-08-09 (×2): 80 mg via ORAL
  Filled 2018-08-07 (×2): qty 1

## 2018-08-07 MED ORDER — METOCLOPRAMIDE HCL 5 MG/ML IJ SOLN
INTRAMUSCULAR | Status: DC | PRN
  Administered 2018-08-07: 10 mg via INTRAVENOUS

## 2018-08-07 MED ORDER — CEFAZOLIN SODIUM-DEXTROSE 2-4 GM/100ML-% IV SOLN
2.0000 g | INTRAVENOUS | Status: AC
  Administered 2018-08-07: 10:00:00 2 g via INTRAVENOUS

## 2018-08-07 MED ORDER — PHENYLEPHRINE HCL (PRESSORS) 10 MG/ML IV SOLN
INTRAVENOUS | Status: DC | PRN
  Administered 2018-08-07 (×6): 80 ug via INTRAVENOUS

## 2018-08-07 MED ORDER — METOCLOPRAMIDE HCL 5 MG/ML IJ SOLN
INTRAMUSCULAR | Status: AC
  Filled 2018-08-07: qty 4

## 2018-08-07 MED ORDER — MEPERIDINE HCL 25 MG/ML IJ SOLN: 6.2500 mg | INTRAMUSCULAR | Status: DC | PRN

## 2018-08-07 MED ORDER — OXYTOCIN 10 UNIT/ML IJ SOLN: INTRAMUSCULAR | Status: DC | PRN

## 2018-08-07 MED ORDER — SIMETHICONE 80 MG PO CHEW
80.0000 mg | CHEWABLE_TABLET | ORAL | Status: DC | PRN
  Filled 2018-08-07: qty 1

## 2018-08-07 MED ORDER — NALOXONE HCL 0.4 MG/ML IJ SOLN: 0.4000 mg | INTRAMUSCULAR | Status: DC | PRN

## 2018-08-07 MED ORDER — FENTANYL CITRATE (PF) 100 MCG/2ML IJ SOLN
INTRAMUSCULAR | Status: AC
  Filled 2018-08-07: qty 4

## 2018-08-07 MED ORDER — ONDANSETRON HCL 4 MG/2ML IJ SOLN
INTRAMUSCULAR | Status: AC
  Filled 2018-08-07: qty 4

## 2018-08-07 MED ORDER — SIMETHICONE 80 MG PO CHEW
80.0000 mg | CHEWABLE_TABLET | Freq: Three times a day (TID) | ORAL | Status: DC
  Administered 2018-08-07 – 2018-08-09 (×7): 80 mg via ORAL
  Filled 2018-08-07 (×6): qty 1

## 2018-08-07 MED ORDER — FENTANYL CITRATE (PF) 100 MCG/2ML IJ SOLN
25.0000 ug | INTRAMUSCULAR | Status: DC | PRN
  Administered 2018-08-07: 13:00:00 50 ug via INTRAVENOUS
  Administered 2018-08-07 (×2): 25 ug via INTRAVENOUS

## 2018-08-07 MED ORDER — DIPHENHYDRAMINE HCL 25 MG PO CAPS: 25.0000 mg | ORAL_CAPSULE | ORAL | Status: DC | PRN

## 2018-08-07 MED ORDER — SCOPOLAMINE 1 MG/3DAYS TD PT72
MEDICATED_PATCH | TRANSDERMAL | Status: AC
  Filled 2018-08-07: qty 2

## 2018-08-07 MED ORDER — NALOXONE HCL 4 MG/10ML IJ SOLN
1.0000 ug/kg/h | INTRAVENOUS | Status: DC | PRN
  Filled 2018-08-07: qty 5

## 2018-08-07 MED ORDER — WITCH HAZEL-GLYCERIN EX PADS: 1.0000 "application " | MEDICATED_PAD | CUTANEOUS | Status: DC | PRN

## 2018-08-07 MED ORDER — ONDANSETRON HCL 4 MG/2ML IJ SOLN: 4.0000 mg | Freq: Once | INTRAMUSCULAR | Status: DC | PRN

## 2018-08-07 MED ORDER — FENTANYL CITRATE (PF) 100 MCG/2ML IJ SOLN
INTRAMUSCULAR | Status: AC
  Filled 2018-08-07: qty 2

## 2018-08-07 MED ORDER — OXYTOCIN 10 UNIT/ML IJ SOLN
INTRAMUSCULAR | Status: DC | PRN
  Administered 2018-08-07: 40 [IU] via INTRAMUSCULAR

## 2018-08-07 MED ORDER — OXYTOCIN 40 UNITS IN NORMAL SALINE INFUSION - SIMPLE MED: 2.5000 [IU]/h | INTRAVENOUS | Status: DC

## 2018-08-07 MED ORDER — PHENYLEPHRINE HCL-NACL 20-0.9 MG/250ML-% IV SOLN
INTRAVENOUS | Status: AC
  Filled 2018-08-07: qty 500

## 2018-08-07 MED ORDER — DIPHENHYDRAMINE HCL 25 MG PO CAPS: 25.0000 mg | ORAL_CAPSULE | Freq: Four times a day (QID) | ORAL | Status: DC | PRN

## 2018-08-07 MED ORDER — OXYTOCIN 40 UNITS IN NORMAL SALINE INFUSION - SIMPLE MED
INTRAVENOUS | Status: AC
  Filled 2018-08-07: qty 2000

## 2018-08-07 SURGICAL SUPPLY — 36 items
BENZOIN TINCTURE PRP APPL 2/3 (GAUZE/BANDAGES/DRESSINGS) ×3 IMPLANT
CHLORAPREP W/TINT 26ML (MISCELLANEOUS) ×3 IMPLANT
CLAMP CORD UMBIL (MISCELLANEOUS) IMPLANT
CLOSURE STERI STRIP 1/2 X4 (GAUZE/BANDAGES/DRESSINGS) ×3 IMPLANT
CLOSURE WOUND 1/2 X4 (GAUZE/BANDAGES/DRESSINGS)
CLOTH BEACON ORANGE TIMEOUT ST (SAFETY) ×3 IMPLANT
DRSG OPSITE POSTOP 4X10 (GAUZE/BANDAGES/DRESSINGS) ×3 IMPLANT
ELECT REM PT RETURN 9FT ADLT (ELECTROSURGICAL) ×3
ELECTRODE REM PT RTRN 9FT ADLT (ELECTROSURGICAL) ×1 IMPLANT
EXTRACTOR VACUUM M CUP 4 TUBE (SUCTIONS) IMPLANT
EXTRACTOR VACUUM M CUP 4' TUBE (SUCTIONS)
GLOVE BIO SURGEON STRL SZ 6.5 (GLOVE) ×2 IMPLANT
GLOVE BIO SURGEONS STRL SZ 6.5 (GLOVE) ×1
GLOVE BIOGEL PI IND STRL 7.0 (GLOVE) ×2 IMPLANT
GLOVE BIOGEL PI INDICATOR 7.0 (GLOVE) ×4
GOWN STRL REUS W/TWL LRG LVL3 (GOWN DISPOSABLE) ×6 IMPLANT
KIT ABG SYR 3ML LUER SLIP (SYRINGE) IMPLANT
NEEDLE HYPO 22GX1.5 SAFETY (NEEDLE) IMPLANT
NEEDLE HYPO 25X5/8 SAFETYGLIDE (NEEDLE) IMPLANT
NS IRRIG 1000ML POUR BTL (IV SOLUTION) ×3 IMPLANT
PACK C SECTION WH (CUSTOM PROCEDURE TRAY) ×3 IMPLANT
PAD OB MATERNITY 4.3X12.25 (PERSONAL CARE ITEMS) ×3 IMPLANT
PENCIL SMOKE EVAC W/HOLSTER (ELECTROSURGICAL) ×3 IMPLANT
STRIP CLOSURE SKIN 1/2X4 (GAUZE/BANDAGES/DRESSINGS) IMPLANT
SUT MON AB 4-0 PS1 27 (SUTURE) ×3 IMPLANT
SUT PLAIN 0 NONE (SUTURE) IMPLANT
SUT PLAIN 2 0 XLH (SUTURE) IMPLANT
SUT VIC AB 0 CT1 36 (SUTURE) ×6 IMPLANT
SUT VIC AB 0 CTX 36 (SUTURE) ×4
SUT VIC AB 0 CTX36XBRD ANBCTRL (SUTURE) ×2 IMPLANT
SUT VIC AB 2-0 CT1 27 (SUTURE) ×2
SUT VIC AB 2-0 CT1 TAPERPNT 27 (SUTURE) ×1 IMPLANT
SYR CONTROL 10ML LL (SYRINGE) IMPLANT
TOWEL OR 17X24 6PK STRL BLUE (TOWEL DISPOSABLE) ×3 IMPLANT
TRAY FOLEY W/BAG SLVR 14FR LF (SET/KITS/TRAYS/PACK) IMPLANT
WATER STERILE IRR 1000ML POUR (IV SOLUTION) ×3 IMPLANT

## 2018-08-07 NOTE — Anesthesia Preprocedure Evaluation (Addendum)
Anesthesia Evaluation  Patient identified by MRN, date of birth, ID band Patient awake    Reviewed: Allergy & Precautions, H&P , NPO status , Patient's Chart, lab work & pertinent test results  History of Anesthesia Complications (+) PONV and history of anesthetic complications  Airway Mallampati: II  TM Distance: >3 FB Neck ROM: full    Dental no notable dental hx.    Pulmonary neg pulmonary ROS,    Pulmonary exam normal        Cardiovascular negative cardio ROS Normal cardiovascular exam     Neuro/Psych negative neurological ROS  negative psych ROS   GI/Hepatic Neg liver ROS, GERD  ,  Endo/Other  negative endocrine ROS  Renal/GU negative Renal ROS  negative genitourinary   Musculoskeletal   Abdominal   Peds  Hematology negative hematology ROS (+)   Anesthesia Other Findings   Reproductive/Obstetrics (+) Pregnancy                            Anesthesia Physical Anesthesia Plan  ASA: II  Anesthesia Plan: Spinal   Post-op Pain Management:    Induction:   PONV Risk Score and Plan: Ondansetron and Treatment may vary due to age or medical condition  Airway Management Planned:   Additional Equipment:   Intra-op Plan:   Post-operative Plan:   Informed Consent: I have reviewed the patients History and Physical, chart, labs and discussed the procedure including the risks, benefits and alternatives for the proposed anesthesia with the patient or authorized representative who has indicated his/her understanding and acceptance.       Plan Discussed with:   Anesthesia Plan Comments:         Anesthesia Quick Evaluation

## 2018-08-07 NOTE — H&P (Signed)
Nicole Mcgrath is a 33 y.o. G2P1 at [redacted]w[redacted]d presenting for RCS. Pt notes no contraction. Good fetal movement, No vaginal bleeding, not leaking fluid.  [facility] wks - Dated by LMP c/w 7wk u/s - prior c/s, desires repeat - prior LEEP   Prenatal Transfer Tool  Maternal Diabetes: No Genetic Screening: Normal Maternal Ultrasounds/Referrals: Normal Fetal Ultrasounds or other Referrals:  None Maternal Substance Abuse:  No Significant Maternal Medications:  None Significant Maternal Lab Results: None     OB History    Gravida  2   Para  1   Term      Preterm      AB      Living        SAB      TAB      Ectopic      Multiple      Live Births             Past Medical History:  Diagnosis Date  . Gastroesophageal reflux disease 04/27/2017  . History of atypical skin mole 04/27/2017  . Kidney stones 04/27/2017  . Myalgia 04/27/2017  . Paresthesia 04/27/2017  . PONV (postoperative nausea and vomiting)   . Stiffness of joint 04/27/2017  . Vaginal Pap smear, abnormal    Past Surgical History:  Procedure Laterality Date  . CESAREAN SECTION  2017  . LEEP     Family History: family history includes Breast cancer in her paternal aunt; Depression in her mother and sister; Diabetes in her maternal uncle; Hashimoto's thyroiditis in her mother and sister; Heart attack in her maternal grandfather and paternal grandfather; Heart disease in her maternal grandfather and paternal grandfather; Hypertension in her father. Social History:  reports that she has never smoked. She has never used smokeless tobacco. She reports current alcohol use. She reports that she does not use drugs.  Review of Systems - Negative except GERD     Blood pressure 109/87, pulse 92, temperature 97.7 F (36.5 C), temperature source Oral, resp. rate 16, height 5\' 2"  (1.575 m), weight 68.8 kg, SpO2 100 %.  Physical Exam:  Gen: well appearing, no distress  Abd: gravid, NT, no  RUQ pain LE: trace edema, equal bilaterally, non-tender Toco: none FH: present  Prenatal labs: ABO, Rh: --/--/O POS (04/21 0757) Antibody: NEG (04/21 0757) Rubella:  immune RPR:   NR HBsAg:  neg  HIV:   neg GBS:   neg 1 hr Glucola 89  Genetic screening nl quad Anatomy US nl   Assessment/Plan: 33 y.o. G2P1 at [redacted]w[redacted]d RCS. R/B d/w pt.   Lendon Colonel 08/07/2018, 9:52 AM

## 2018-08-07 NOTE — Op Note (Signed)
08/07/2018  11:05 AM  PATIENT:  Nicole Mcgrath  33 y.o. female  PRE-OPERATIVE DIAGNOSIS:  Previous Cesarean Section  POST-OPERATIVE DIAGNOSIS:  Previous Cesarean Section  PROCEDURE:  Procedure(s) with comments: Repeat CESAREAN SECTION (N/A) - EDD: 08/14/18  Low-transverse cesarean section with 2 layer closure  SURGEON:  Surgeon(s) and Role:    * Noland Fordyce, MD - Primary  PHYSICIAN ASSISTANT:   ASSISTANTS: Arlan Organ, CNM  ANESTHESIA:   spinal  EBL:  195 mL   BLOOD ADMINISTERED:none  DRAINS: Urinary Catheter (Foley)   LOCAL MEDICATIONS USED:  NONE  SPECIMEN:  Source of Specimen:  Placenta  DISPOSITION OF SPECIMEN:  Labor and delivery  COUNTS:  YES  TOURNIQUET:  * No tourniquets in log *  DICTATION: .Note written in EPIC  PLAN OF CARE: Admit to inpatient   PATIENT DISPOSITION:  PACU - hemodynamically stable.   Delay start of Pharmacological VTE agent (>24hrs) due to surgical blood loss or risk of bleeding: yes    Findings:  @BABYSEXEBC @ infant,  APGAR (1 MIN):   APGAR (5 MINS):   APGAR (10 MINS):   Normal uterus, tubes and ovaries, normal placenta. 3VC, clear amniotic fluid  EBL: 200 cc Antibiotics:   2g Ancef Complications: none  Indications: This is a 33 y.o. year-old, G2, P1 at [redacted]w[redacted]d admitted for repeat cesarean section.  Given history of cephalopelvic disproportion and traumatic cesarean section in past due to prolonged labor attempt and chorioamnionitis with neonatal sepsis, patient declined trial of labor after cesarean section.. Risks benefits and alternatives of the procedure were discussed with the patient who agreed to proceed  Procedure:  After informed consent was obtained the patient was taken to the operating room where spinal anesthesia was initiated.  She was prepped and draped in the normal sterile fashion in dorsal supine position with a leftward tilt.  A foley catheter was in place.  A Pfannenstiel skin incision was made 2 cm  above the pubic symphysis in the midline with the scalpel.  Dissection was carried down with the Bovie cautery until the fascia was reached. The fascia was incised in the midline. The incision was extended laterally with the Mayo scissors.  The bladder and peritoneum was seen coming through a defect in the pyramid Allis muscles.  Sharp dissection was done to free the bladder and peritoneum.  The inferior aspect of the fascial incision was grasped with the Coker clamps, elevated up and the underlying rectus muscles were dissected off sharply. The superior aspect of the fascial incision was grasped with the Coker clamps elevated up and the underlying rectus muscles were dissected off sharply.  The peritoneum was entered both bluntly and sharply. The peritoneal incision was extended superiorly and inferiorly with good visualization of the bladder. The bladder blade was inserted and palpation was done to assess the fetal position and the location of the uterine vessels. The lower segment of the uterus was incised sharply with the scalpel and extended  bluntly in the cephalo-caudal fashion. The infant was grasped, brought to the incision,  rotated and the infant was delivered with fundal pressure. The nose and mouth were bulb suctioned. The cord was clamped and cut after 1 minute delay. The infant was handed off to the waiting pediatrician. The placenta was expressed. The uterus was exteriorized. The uterus was cleared of all clots and debris. The uterine incision was repaired with 0 Vicryl in a running locked fashion.  A second layer of the same suture was used in an  imbricating fashion to obtain excellent hemostasis.  The uterus was then returned to the abdomen, the gutters were cleared of all clots and debris. The uterine incision was reinspected and found to be hemostatic. The peritoneum was grasped and closed with 2-0 Vicryl in a running fashion. The cut muscle edges and the underside of the fascia were inspected  and found to be hemostatic. The fascia was closed with 0 Vicryl in a single layer . The subcutaneous tissue was irrigated. Scarpa's layer was closed with a 2-0 plain gut suture. The skin was closed with a 4-0 Monocryl in a single layer. The patient tolerated the procedure well. Sponge lap and needle counts were correct x3 and patient was taken to the recovery room in a stable condition.  Lendon ColonelKelly A Yovanni Frenette 08/07/2018 11:06 AM

## 2018-08-07 NOTE — Lactation Note (Signed)
This note was copied from a baby's chart. Lactation Consultation Note  Patient Name: Nicole Mcgrath GLOVF'I Date: 08/07/2018 Reason for consult: Initial assessment;Term  7 hours old FT female who is being exclusively BF by his mother, she's a P2 and experienced BF. She was able to BF her first child for 9 months (he was a NICU baby) but the last two were exclusively pumping and bottle. Mom voiced that baby # 1 had a posterior tongue tied and acid reflux which made BF challenging. She's already familiar with hand expression and able to obtain colostrum very easily. When [facility] assisted with hand expression several drops of colostrum were pouring out of her left breast. LC showed parents how to finger feed baby. Mom has 2 DEBP at home, a Medela and an Ameda one.  Baby was asleep when entering the room, offered assistance with latch and mom agreed to wake baby up to feed, she said he was due for a feeding. LC took baby to the bassinet and he woke up, but baby is still very spitty and having a gagging reflex, he won't suck on a gloved finger. LC took baby STS to mom's left breast in football position per her request, but baby unable to latch. LC tried for at least 5 minutes but baby just kept falling asleep with the nipple in his mouth. Hand expressed more drops and finger fed baby. An attempt was documented in Flowsheets. Reviewed normal newborn behavior, cluster feeding, feeding cues and newborn sleeping cycle.   Feeding plan:  1. Encouraged mom to feed baby STS 8-12 times/24 hours or sooner if feeding cues are present 2. Hand expression and spoon feeding were also encouraged.  BF brochure, BF resources and feeding diary were reviewed. Parents reported all questions and concerns were answered, they're both aware of LC services and will call PRN.  Maternal Data Formula Feeding for Exclusion: No Has patient been taught Hand Expression?: Yes Does the patient have breastfeeding experience prior to  this delivery?: Yes  Feeding Feeding Type: Breast Fed  Interventions Interventions: Breast feeding basics reviewed;Assisted with latch;Skin to skin;Breast massage;Hand express;Breast compression;Adjust position;Support pillows  Lactation Tools Discussed/Used WIC Program: No   Consult Status Consult Status: Follow-up Date: 08/08/18 Follow-up type: In-patient    Nicole Mcgrath 08/07/2018, 5:28 PM

## 2018-08-07 NOTE — Anesthesia Procedure Notes (Signed)
Spinal  Patient location during procedure: OR Staffing Anesthesiologist: Vieno Tarrant E, MD Performed: anesthesiologist  Preanesthetic Checklist Completed: patient identified, surgical consent, pre-op evaluation, timeout performed, IV checked, risks and benefits discussed and monitors and equipment checked Spinal Block Patient position: sitting Prep: site prepped and draped and DuraPrep Patient monitoring: continuous pulse ox, blood pressure and heart rate Approach: midline Location: L3-4 Injection technique: single-shot Needle Needle type: Pencan  Needle gauge: 24 G Needle length: 9 cm Additional Notes Functioning IV was confirmed and monitors were applied. Sterile prep and drape, including hand hygiene and sterile gloves were used. The patient was positioned and the spine was prepped. The skin was anesthetized with lidocaine.  Free flow of clear CSF was obtained prior to injecting local anesthetic into the CSF. The needle was carefully withdrawn. The patient tolerated the procedure well.      

## 2018-08-07 NOTE — Transfer of Care (Signed)
Immediate Anesthesia Transfer of Care Note  Patient: Nicole Mcgrath  Procedure(s) Performed: Repeat CESAREAN SECTION (N/A )  Patient Location: PACU  Anesthesia Type:Spinal  Level of Consciousness: awake, alert  and oriented  Airway & Oxygen Therapy: Patient Spontanous Breathing and Patient connected to nasal cannula oxygen  Post-op Assessment: Report given to RN and Post -op Vital signs reviewed and stable  Post vital signs: Reviewed and stable  Last Vitals:  Vitals Value Taken Time  BP 73/61 08/07/2018 11:15 AM  Temp    Pulse 85 08/07/2018 11:16 AM  Resp 20 08/07/2018 11:16 AM  SpO2 96 % 08/07/2018 11:16 AM  Vitals shown include unvalidated device data.  Last Pain:  Vitals:   08/07/18 0743  TempSrc: Oral         Complications: No apparent anesthesia complications

## 2018-08-07 NOTE — Brief Op Note (Signed)
08/07/2018  11:05 AM  PATIENT:  Nicole Mcgrath  33 y.o. female  PRE-OPERATIVE DIAGNOSIS:  Previous Cesarean Section  POST-OPERATIVE DIAGNOSIS:  Previous Cesarean Section  PROCEDURE:  Procedure(s) with comments: Repeat CESAREAN SECTION (N/A) - EDD: 08/14/18  Low-transverse cesarean section with 2 layer closure  SURGEON:  Surgeon(s) and Role:    * Noland Fordyce, MD - Primary  PHYSICIAN ASSISTANT:   ASSISTANTS: Arlan Organ, CNM  ANESTHESIA:   spinal  EBL:  195 mL   BLOOD ADMINISTERED:none  DRAINS: Urinary Catheter (Foley)   LOCAL MEDICATIONS USED:  NONE  SPECIMEN:  Source of Specimen:  Placenta  DISPOSITION OF SPECIMEN:  Labor and delivery  COUNTS:  YES  TOURNIQUET:  * No tourniquets in log *  DICTATION: .Note written in EPIC  PLAN OF CARE: Admit to inpatient   PATIENT DISPOSITION:  PACU - hemodynamically stable.   Delay start of Pharmacological VTE agent (>24hrs) due to surgical blood loss or risk of bleeding: yes

## 2018-08-08 LAB — CBC
HCT: 31.9 % — ABNORMAL LOW (ref 36.0–46.0)
Hemoglobin: 10.3 g/dL — ABNORMAL LOW (ref 12.0–15.0)
MCH: 30 pg (ref 26.0–34.0)
MCHC: 32.3 g/dL (ref 30.0–36.0)
MCV: 93 fL (ref 80.0–100.0)
Platelets: 200 10*3/uL (ref 150–400)
RBC: 3.43 MIL/uL — ABNORMAL LOW (ref 3.87–5.11)
RDW: 13.9 % (ref 11.5–15.5)
WBC: 14.7 10*3/uL — ABNORMAL HIGH (ref 4.0–10.5)
nRBC: 0 % (ref 0.0–0.2)

## 2018-08-08 MED ORDER — MAGNESIUM OXIDE 400 (241.3 MG) MG PO TABS
400.0000 mg | ORAL_TABLET | Freq: Every day | ORAL | Status: DC
  Administered 2018-08-08 – 2018-08-09 (×2): 400 mg via ORAL
  Filled 2018-08-08 (×2): qty 1

## 2018-08-08 MED ORDER — POLYSACCHARIDE IRON COMPLEX 150 MG PO CAPS
150.0000 mg | ORAL_CAPSULE | Freq: Every day | ORAL | Status: DC
  Administered 2018-08-08 – 2018-08-09 (×2): 150 mg via ORAL
  Filled 2018-08-08 (×2): qty 1

## 2018-08-08 NOTE — Progress Notes (Signed)
POSTOPERATIVE DAY # 1 S/P Repeat LTCS, baby boy "[place]"   S:         Reports feeling okay, more sore now that she is up moving around more             Tolerating po intake / no nausea / no vomiting / no flatus / no BM  Denies dizziness, SOB, or CP             Bleeding is light             Pain controlled with Motrin and Percocet             Up ad lib / ambulatory/ voiding QS  Newborn breast feeding - going okay; had breastfeeding issues in the past due baby's lip/tongue tie; states this baby isn't not latching as well either, using breast shells in between feeds / Circumcision - completed    O:  VS: BP (!) 103/59   Pulse 74   Temp 98 F (36.7 C)   Resp 18   Ht 5\' 2"  (1.575 m)   Wt 68.8 kg   SpO2 100%   Breastfeeding Unknown   BMI 27.73 kg/m    LABS:               Recent Labs    08/07/18 0758 08/08/18 0541  WBC 11.9* 14.7*  HGB 12.5 10.3*  PLT 221 200               Bloodtype: --/--/O POS, O POS Performed at [facility], [address] [place], [place] [address]  [phone](04/21 0757)  Rubella:                                               I&O: Intake/Output      04/21 0701 - 04/22 0700 04/22 0701 - 04/23 0700   P.O. 480    I.V. (mL/kg) 3849 (55.9)    Total Intake(mL/kg) 4329 (62.9)    Urine (mL/kg/hr) 2852    Blood 195    Total Output 3047    Net +1282                      Physical Exam:             Alert and Oriented X3  Lungs: Clear and unlabored  Heart: regular rate and rhythm / no murmurs  Abdomen: soft, non-tender, non-distended, active bowel sounds in all quadrants             Fundus: firm, non-tender, U-2             Dressing honeycomb with steri-strips c/d/i              Incision:  approximated with sutures / no erythema / no ecchymosis / no drainage  Perineum: intact  Lochia: small, just changed pad  Extremities: no edema, no calf pain or tenderness,   A:        POD # 1 S/P Repeat LTCS            Mild ABL Anemia   P:        Routine  postoperative care              Niferex 150mg  PO daily  Magnesium oxide 400mg  PO daily   Warm liquids and ambulation to promote  bowel motility   Continue working with lactation  Desires early discharge home tomorrow   Carlean Jews, MSN, CNM [facility]

## 2018-08-08 NOTE — Lactation Note (Signed)
This note was copied from a baby's chart. Lactation Consultation Note  Patient Name: Nicole Mcgrath SHFWY'O Date: 08/08/2018 Reason for consult: Follow-up assessment;Difficult latch;Term  P2 mother whose infant is now 38 hours old.    Mother had just finished breast feeding baby when I arrived.  Mother stated that when she removed baby from her right breast the nipple was flattened like "a tube of lipstick" and she had pain.  She knew this was not a good latch and wanted some advice on how to obtain a better latch.  Baby was sleeping in her arms.  Even though I did not believe he would awaken mother still wanted to try since I was in the room.  Removed his swaddle and sleeper and placed him STS.  Baby has a "biting" suck and a small mouth.  Mother verbalized having difficulty getting him to open wide.  Attempted to latch baby to left breast in the football hold but he was not interested at all.  He would not open his mouth initially.  Gentle stimulation aroused him and he was able to latch and took a few sucks.  Mother stated it "felt better."  However, baby was still not interested in continuing to suck and became fussy at the breast.  I explained to mother that I did not believe he wanted to feed any longer.  Placed him STS and he fell asleep.  Offered to return as needed if she cannot obtain a pain free latch.  Mother has coconut oil and cooled comfort gels in the refrigerator.  Offered to place gels and mother agreed to using them after feeding.  Mother will call for assistance as needed.   Maternal Data Formula Feeding for Exclusion: No Has patient been taught Hand Expression?: Yes Does the patient have breastfeeding experience prior to this delivery?: Yes  Feeding Feeding Type: Breast Fed  LATCH Score Latch: Too sleepy or reluctant, no latch achieved, no sucking elicited.(Mother wanted to try even though she had just recently breast fed him; he was not interested)  Audible  Swallowing: None  Type of Nipple: Everted at rest and after stimulation  Comfort (Breast/Nipple): Filling, red/small blisters or bruises, mild/mod discomfort  Hold (Positioning): Assistance needed to correctly position infant at breast and maintain latch.  LATCH Score: 4  Interventions Interventions: Breast feeding basics reviewed;Assisted with latch;Skin to skin;Hand express;Breast compression;Comfort gels;Coconut oil;Position options;Support pillows;Adjust position  Lactation Tools Discussed/Used Tools: Coconut oil;Comfort gels WIC Program: No   Consult Status Consult Status: Follow-up Date: 08/09/18 Follow-up type: In-patient    Gianfranco Araki R Serin Thornell 08/08/2018, 5:02 PM

## 2018-08-08 NOTE — Lactation Note (Signed)
This note was copied from a baby's chart. Lactation Consultation Note  Patient Name: Nicole Mcgrath JSEGB'T Date: 08/08/2018 Reason for consult: Follow-up assessment Baby is 23 hours old/3% weight loss..  Mom states that nipple is pinched when baby comes off breast.  RN gave her a nipple shield to try last night but baby didn't do well with it.  Baby recently returned from circumcision and is sleepy.  Recommended she call out for feeding assist when baby ready to feed.  Maternal Data    Feeding Feeding Type: Breast Fed  LATCH Score                   Interventions    Lactation Tools Discussed/Used     Consult Status Consult Status: Follow-up Date: 08/08/18 Follow-up type: In-patient    Huston Foley 08/08/2018, 10:20 AM

## 2018-08-08 NOTE — Anesthesia Postprocedure Evaluation (Signed)
Anesthesia Post Note  Patient: Shelinda Orloski Sundell  Procedure(s) Performed: Repeat CESAREAN SECTION (N/A )     Patient location during evaluation: Mother Baby Anesthesia Type: Spinal Level of consciousness: awake, awake and alert and oriented Pain management: pain level controlled Vital Signs Assessment: post-procedure vital signs reviewed and stable Respiratory status: spontaneous breathing, nonlabored ventilation and respiratory function stable Cardiovascular status: stable Postop Assessment: no headache, no backache, patient able to bend at knees, no apparent nausea or vomiting, adequate PO intake and able to ambulate Anesthetic complications: no    Last Vitals:  Vitals:   08/08/18 0241 08/08/18 0620  BP: 91/61 96/66  Pulse: 66 66  Resp: 18 18  Temp: 36.6 C 36.6 C  SpO2: 100% 98%    Last Pain:  Vitals:   08/08/18 0622  TempSrc:   PainSc: 2    Pain Goal:                   Kaelea Gathright

## 2018-08-09 ENCOUNTER — Other Ambulatory Visit: Payer: Self-pay

## 2018-08-09 MED ORDER — POLYSACCHARIDE IRON COMPLEX 150 MG PO CAPS: 150.0000 mg | ORAL_CAPSULE | Freq: Every day | ORAL | 0 refills | Status: DC

## 2018-08-09 MED ORDER — OXYCODONE-ACETAMINOPHEN 5-325 MG PO TABS: 1.0000 | ORAL_TABLET | Freq: Three times a day (TID) | ORAL | 0 refills | Status: AC | PRN

## 2018-08-09 MED ORDER — MAGNESIUM OXIDE 400 (241.3 MG) MG PO TABS: 400.0000 mg | ORAL_TABLET | Freq: Every day | ORAL | 0 refills | Status: DC

## 2018-08-09 MED ORDER — IBUPROFEN 800 MG PO TABS: 800.0000 mg | ORAL_TABLET | Freq: Three times a day (TID) | ORAL | 0 refills | Status: DC

## 2018-08-09 NOTE — Discharge Summary (Signed)
OB Discharge Summary  Patient Name: Nicole Mcgrath DOB: 1985/05/05 MRN: [id number]  Date of admission: 08/07/2018 Admitting diagnosis: Previous Cesarean Section Intrauterine pregnancy: [redacted]w[redacted]d      Date of discharge: 08/09/2018    Discharge diagnosis: Term Pregnancy Delivered and Anemia and POD #2 s/p repeat CS    Prenatal history: G2P1001   EDC : 08/14/2018, by Other Basis  Prenatal care at [facility]  Primary provider : Ernestina Penna Prenatal course complicated by previous CS  Prenatal Labs: ABO, Rh: --/--/O POS, O POS Performed at [facility], [address], [place], [place] [address]  [phone]/21 0757) Antibody: NEG (04/21 0757) Rubella:  Immune RPR: Non Reactive (04/21 0758)  HBsAg:   Negative HIV:   NR                         Hospital course:  Sceduled C/S   33 y.o. yo G2P1001 at [redacted]w[redacted]d was admitted to the hospital 08/07/2018 for scheduled cesarean section with the following indication:Elective Repeat. Membrane Rupture: 10:27 AM ,08/07/2018  Patient delivered a Viable infant.08/07/2018 Details of operation can be found in separate operative note.   Patient had an uncomplicated postpartum course.  She is ambulating, tolerating a regular diet, passing flatus, and urinating well. Patient is discharged home in stable condition on  08/09/18 POD #2.        Delivering PROVIDER: Noland Fordyce                                                            Complications: None  Newborn Data: Live born female  Birth Weight: 7 lb 2.8 oz (3255 g) APGAR: 9, 9  Newborn Delivery   Birth date/time:  08/07/2018 10:27:00 Delivery type:  C-Section, Low Transverse Trial of labor:  No C-section categorization:  Repeat     Baby Feeding: Breast Disposition:home with mother  Post partum procedures:none  Labs: Lab Results  Component Value Date   WBC 14.7 (H) 08/08/2018   HGB 10.3 (L) 08/08/2018   HCT 31.9 (L) 08/08/2018   MCV 93.0 08/08/2018   PLT 200 08/08/2018    CMP Latest Ref Rng & Units 09/07/2008  Glucose 70 - 99 mg/dL 89  BUN 6 - 23 mg/dL 11  Creatinine 0.4 - 1.2 mg/dL 0.67  Sodium 703 - 403 mEq/L 137  Potassium 3.5 - 5.1 mEq/L 3.7  Chloride 96 - 112 mEq/L 107  CO2 19 - 32 mEq/L 25  Calcium 8.4 - 10.5 mg/dL 8.7  Total Protein 6.0 - 8.3 g/dL 7.4  Total Bilirubin 0.3 - 1.2 mg/dL 0.9  Alkaline Phos 39 - 117 U/L 94  AST 0 - 37 U/L 22  ALT 0 - 35 U/L 16      Physical Exam @ time of discharge:  Vitals:   08/08/18 0945 08/08/18 1440 08/08/18 2206 08/09/18 0612  BP: (!) 103/59 105/62 101/60 110/72  Pulse: 74 83 78 71  Resp: 18 18 18 18   Temp: 98 F (36.7 C) 98.2 F (36.8 C) 98.2 F (36.8 C) 97.7 F (36.5 C)  TempSrc:  Oral Oral Axillary  SpO2: 100%   100%  Weight:      Height:       General: alert, cooperative  and no distress Lochia: appropriate Uterine Fundus: firm Incision: Healing well with no significant drainage Extremities: trace ankle edema DVT Evaluation: No evidence of DVT seen on physical exam.  Discharge instructions:  "Baby and Me Booklet" and [facility] Booklet  Discharge Medications:  Allergies as of 08/09/2018      Reactions   Iodine Rash   rash post CS      Medication List    STOP taking these medications   doxylamine (Sleep) 25 MG tablet Commonly known as:  UNISOM     TAKE these medications   ibuprofen 800 MG tablet Commonly known as:  ADVIL Take 1 tablet (800 mg total) by mouth every 8 (eight) hours.   iron polysaccharides 150 MG capsule Commonly known as:  NIFEREX Take 1 capsule (150 mg total) by mouth daily. Start taking on:  August 10, 2018   magnesium oxide 400 (241.3 Mg) MG tablet Commonly known as:  MAG-OX Take 1 tablet (400 mg total) by mouth daily. Start taking on:  August 10, 2018   oxyCODONE-acetaminophen 5-325 MG tablet Commonly known as:  PERCOCET/ROXICET Take 1 tablet by mouth every 8 (eight) hours as needed for up to 3 days for moderate pain.   PRENATAL ADULT GUMMY/DHA/FA  PO Take 2 tablets by mouth daily. Vitafusion prenatal   ranitidine 150 MG tablet Commonly known as:  ZANTAC Take 150 mg by mouth 2 (two) times daily.            Discharge Care Instructions  (From admission, onward)         Start     Ordered   08/09/18 0000  Discharge wound care:    Comments:  Leave honeycomb in place for 5 days - remove if get wet in shower. Leave steri-strips in place x 2 weeks. Keep incision clean and dry   08/09/18 0924         Diet: routine diet Activity: Advance as tolerated. Pelvic rest x 6 weeks.  Follow up:6 weeks  Signed: Marlinda Mikeanya Awais Cobarrubias CNM, MSN, [facility] 08/09/2018, 9:24 AM

## 2018-08-09 NOTE — Progress Notes (Signed)
POSTOPERATIVE DAY # 2 S/P Repeat CS  S:         Reports feeling well - desires DC home             Tolerating po intake / no nausea / no vomiting / + flatus / no BM             Bleeding is light             Pain controlled with Motrin and oxycodone             Up ad lib / ambulatory/ voiding QS  Newborn Breast  O:  VS: BP 110/72 (BP Location: Right Arm)   Pulse 71   Temp 97.7 F (36.5 C) (Axillary)   Resp 18   Ht 5\' 2"  (1.575 m)   Wt 68.8 kg   SpO2 100%   Breastfeeding Unknown   BMI 27.73 kg/m    LABS:              Recent Labs    08/07/18 0758 08/08/18 0541  WBC 11.9* 14.7*  HGB 12.5 10.3*  PLT 221 200               Bloodtype: --/--/O POS, O POS Performed at [facility], [address], [place], [place] [address]  [phone])                                Physical Exam:             Alert and Oriented X3  Abdomen: soft, non-tender, non-distended active BS             Fundus: firm, non-tender, Ueven             Dressing intact honeycomb with steri-strips              Incision:  approximated with suture / no erythema / no ecchymosis / no drainage  Extremities: trace ankle edema edema, no calf pain or tenderness, negative Homans  A:        POD # 2 S/P CS            ABL anemia  P:        Routine postoperative care              DC HOME - WOB discharge instructions reviewed / WOB booklet  Marlinda Mike CNM, MSN, [facility] 08/09/2018, 8:56 AM

## 2018-08-09 NOTE — Lactation Note (Signed)
This note was copied from a baby's chart. Lactation Consultation Note  Patient Name: Nicole Mcgrath VXYIA'X Date: 08/09/2018 Reason for consult: Term;Follow-up assessment Baby is 48 hours old/6% weight loss.  Mom reports that baby cluster fed all night.  He has been fussy at breast this morning and coming off crying.  On oral exam short posterior frenulum noted.  Baby does not elevate tongue well.  First baby had a tongue revision. Assisted with positioning baby in football hold.  Baby opened mouth but unable to grasp tissue.  Mom's breasts are becoming full.  Discussed prevention and treatment of engorgement.  She has a DEBP at home.  After a few minutes baby fell asleep at breast.  Discussed pre pumping prior to latch attempt to soften breast a little.  Symphony pump set up and initiated. Mom pumped total of 10 mls of transitional milk.  She will give this to baby when he wakes.  Mom will continue to post pump every 3 hours giving expressed milk back to baby.  Reviewed lactation outpatient services and support and recommended follow up.  Maternal Data    Feeding Feeding Type: Breast Fed  LATCH Score Latch: Too sleepy or reluctant, no latch achieved, no sucking elicited.  Audible Swallowing: None  Type of Nipple: Everted at rest and after stimulation  Comfort (Breast/Nipple): Filling, red/small blisters or bruises, mild/mod discomfort  Hold (Positioning): Assistance needed to correctly position infant at breast and maintain latch.  LATCH Score: 4  Interventions    Lactation Tools Discussed/Used Pump Review: Setup, frequency, and cleaning;Milk Storage Initiated by:: LMoulden Date initiated:: 08/09/18   Consult Status Consult Status: Complete Follow-up type: Call as needed    Nicole Mcgrath 08/09/2018, 11:04 AM

## 2018-09-26 DIAGNOSIS — K080 Exfoliation of teeth due to systemic causes: Secondary | ICD-10-CM | POA: Diagnosis not present

## 2018-11-26 ENCOUNTER — Other Ambulatory Visit: Payer: Self-pay

## 2018-11-26 DIAGNOSIS — Z20822 Contact with and (suspected) exposure to covid-19: Secondary | ICD-10-CM

## 2018-11-27 LAB — NOVEL CORONAVIRUS, NAA: SARS-CoV-2, NAA: NOT DETECTED

## 2019-06-07 DIAGNOSIS — Z20828 Contact with and (suspected) exposure to other viral communicable diseases: Secondary | ICD-10-CM | POA: Diagnosis not present

## 2019-07-12 ENCOUNTER — Ambulatory Visit: Payer: Self-pay | Attending: Internal Medicine

## 2019-07-12 DIAGNOSIS — Z23 Encounter for immunization: Secondary | ICD-10-CM

## 2019-07-12 NOTE — Progress Notes (Signed)
   Covid-19 Vaccination Clinic  Name:  KASSI ESTEVE    MRN: [id number] DOB: [birth year]  07/12/2019  Ms. Smedley was observed post Covid-19 immunization for 15 minutes without incident. She was provided with Vaccine Information Sheet and instruction to access the V-Safe system.   Ms. Fordham was instructed to call 911 with any severe reactions post vaccine: Marland Kitchen Difficulty breathing  . Swelling of face and throat  . A fast heartbeat  . A bad rash all over body  . Dizziness and weakness   Immunizations Administered    Name Date Dose VIS Date Route   Pfizer COVID-19 Vaccine 07/12/2019  9:15 AM 0.3 mL 03/29/2019 Intramuscular   Manufacturer: [employer], [employer]   Lot: [id number]   NDC: [address]-3

## 2019-08-13 ENCOUNTER — Ambulatory Visit: Payer: Self-pay | Attending: Internal Medicine

## 2019-08-13 DIAGNOSIS — Z23 Encounter for immunization: Secondary | ICD-10-CM

## 2019-08-13 NOTE — Progress Notes (Signed)
   Covid-19 Vaccination Clinic  Name:  Nicole Mcgrath    MRN: [id number] DOB: January 31, 1986  08/13/2019  [name] was observed post Covid-19 immunization for 15 minutes without incident. She was provided with Vaccine Information Sheet and instruction to access the V-Safe system.   [name] was instructed to call 911 with any severe reactions post vaccine: Marland Kitchen Difficulty breathing  . Swelling of face and throat  . A fast heartbeat  . A bad rash all over body  . Dizziness and weakness   Immunizations Administered    Name Date Dose VIS Date Route   Pfizer COVID-19 Vaccine 08/13/2019  9:02 AM 0.3 mL 06/12/2018 Intramuscular   Manufacturer: [employer], [employer]   Lot: [id number]   NDC: [address]-4

## 2019-08-16 DIAGNOSIS — F411 Generalized anxiety disorder: Secondary | ICD-10-CM | POA: Diagnosis not present

## 2019-08-19 DIAGNOSIS — F411 Generalized anxiety disorder: Secondary | ICD-10-CM | POA: Diagnosis not present

## 2019-08-23 DIAGNOSIS — F411 Generalized anxiety disorder: Secondary | ICD-10-CM | POA: Diagnosis not present

## 2019-08-30 DIAGNOSIS — F411 Generalized anxiety disorder: Secondary | ICD-10-CM | POA: Diagnosis not present

## 2019-09-09 DIAGNOSIS — F411 Generalized anxiety disorder: Secondary | ICD-10-CM | POA: Diagnosis not present

## 2019-09-13 DIAGNOSIS — F411 Generalized anxiety disorder: Secondary | ICD-10-CM | POA: Diagnosis not present

## 2019-10-07 DIAGNOSIS — F411 Generalized anxiety disorder: Secondary | ICD-10-CM | POA: Diagnosis not present

## 2019-10-08 DIAGNOSIS — F411 Generalized anxiety disorder: Secondary | ICD-10-CM | POA: Diagnosis not present

## 2019-10-15 DIAGNOSIS — Z6822 Body mass index (BMI) 22.0-22.9, adult: Secondary | ICD-10-CM | POA: Diagnosis not present

## 2019-10-15 DIAGNOSIS — Z01419 Encounter for gynecological examination (general) (routine) without abnormal findings: Secondary | ICD-10-CM | POA: Diagnosis not present

## 2019-10-15 DIAGNOSIS — Z1329 Encounter for screening for other suspected endocrine disorder: Secondary | ICD-10-CM | POA: Diagnosis not present

## 2019-10-15 DIAGNOSIS — Z0000 Encounter for general adult medical examination without abnormal findings: Secondary | ICD-10-CM | POA: Diagnosis not present

## 2019-10-15 DIAGNOSIS — Z130 Encounter for screening for diseases of the blood and blood-forming organs and certain disorders involving the immune mechanism: Secondary | ICD-10-CM | POA: Diagnosis not present

## 2019-10-15 DIAGNOSIS — Z131 Encounter for screening for diabetes mellitus: Secondary | ICD-10-CM | POA: Diagnosis not present

## 2019-10-15 DIAGNOSIS — Z13220 Encounter for screening for lipoid disorders: Secondary | ICD-10-CM | POA: Diagnosis not present

## 2019-10-30 DIAGNOSIS — F411 Generalized anxiety disorder: Secondary | ICD-10-CM | POA: Diagnosis not present

## 2019-11-05 DIAGNOSIS — F411 Generalized anxiety disorder: Secondary | ICD-10-CM | POA: Diagnosis not present

## 2020-01-29 DIAGNOSIS — F411 Generalized anxiety disorder: Secondary | ICD-10-CM | POA: Diagnosis not present

## 2020-02-26 DIAGNOSIS — F411 Generalized anxiety disorder: Secondary | ICD-10-CM | POA: Diagnosis not present

## 2020-03-06 DIAGNOSIS — N649 Disorder of breast, unspecified: Secondary | ICD-10-CM | POA: Diagnosis not present

## 2020-03-06 DIAGNOSIS — Z23 Encounter for immunization: Secondary | ICD-10-CM | POA: Diagnosis not present

## 2020-03-19 DIAGNOSIS — F22 Delusional disorders: Secondary | ICD-10-CM | POA: Diagnosis not present

## 2020-03-19 DIAGNOSIS — R8290 Unspecified abnormal findings in urine: Secondary | ICD-10-CM | POA: Diagnosis not present

## 2020-03-19 DIAGNOSIS — R319 Hematuria, unspecified: Secondary | ICD-10-CM | POA: Diagnosis not present

## 2020-03-19 DIAGNOSIS — R350 Frequency of micturition: Secondary | ICD-10-CM | POA: Diagnosis not present

## 2020-03-20 ENCOUNTER — Ambulatory Visit: Payer: Self-pay

## 2020-04-13 ENCOUNTER — Ambulatory Visit (INDEPENDENT_AMBULATORY_CARE_PROVIDER_SITE_OTHER): Payer: Federal, State, Local not specified - PPO

## 2020-04-13 ENCOUNTER — Other Ambulatory Visit: Payer: Self-pay

## 2020-04-13 ENCOUNTER — Ambulatory Visit
Admission: RE | Admit: 2020-04-13 | Discharge: 2020-04-13 | Disposition: A | Discharge status: Home / Self Care | Payer: Federal, State, Local not specified - PPO | Source: Ambulatory Visit | Attending: Family Medicine | Admitting: Family Medicine

## 2020-04-13 VITALS — BP 105/73 | HR 76 | Temp 98.0°F | Resp 16

## 2020-04-13 DIAGNOSIS — R059 Cough, unspecified: Secondary | ICD-10-CM | POA: Diagnosis not present

## 2020-04-13 DIAGNOSIS — R509 Fever, unspecified: Secondary | ICD-10-CM

## 2020-04-13 DIAGNOSIS — B349 Viral infection, unspecified: Secondary | ICD-10-CM | POA: Diagnosis not present

## 2020-04-13 DIAGNOSIS — R319 Hematuria, unspecified: Secondary | ICD-10-CM

## 2020-04-13 DIAGNOSIS — R0602 Shortness of breath: Secondary | ICD-10-CM | POA: Diagnosis not present

## 2020-04-13 LAB — POCT URINALYSIS DIP (MANUAL ENTRY)
Bilirubin, UA: NEGATIVE
Glucose, UA: NEGATIVE mg/dL
Ketones, POC UA: NEGATIVE mg/dL
Leukocytes, UA: NEGATIVE
Nitrite, UA: NEGATIVE
Protein Ur, POC: NEGATIVE mg/dL
Spec Grav, UA: 1.015 (ref 1.010–1.025)
Urobilinogen, UA: 0.2 E.U./dL
pH, UA: 6 (ref 5.0–8.0)

## 2020-04-13 MED ORDER — DEXAMETHASONE SODIUM PHOSPHATE 10 MG/ML IJ SOLN
10.0000 mg | Freq: Once | INTRAMUSCULAR | Status: AC
  Administered 2020-04-13: 10 mg via INTRAMUSCULAR

## 2020-04-13 NOTE — Discharge Instructions (Signed)
Your x-ray was normal. Your Covid and flu swabs are pending.  Your urine did have trace blood but no signs of a urinary tract infection. You can continue over-the-counter medicines as needed for your symptoms Follow up as needed for continued or worsening symptoms

## 2020-04-14 LAB — COVID-19, FLU A+B NAA
Influenza A, NAA: NOT DETECTED
Influenza B, NAA: NOT DETECTED
SARS-CoV-2, NAA: DETECTED — AB

## 2020-04-14 NOTE — ED Provider Notes (Signed)
Renaldo Fiddler    CSN: [id number] Arrival date & time: 04/13/20  1143      History   Chief Complaint No chief complaint on file.   HPI KIMYATTA LECY is a 34 y.o. female.   Patient is a 34 year old female who presents today with cough, chest congestion, sore throat, fevers, body aches.  Symptoms have been constant, waxing waning over the past week or so.  Has been taking over-the-counter medications without much relief.  Took a Covid test at home with faint positive line.  Also believes she may have been passing kidney stones.  Reporting passing sediments with urination.  Mild dysuria.  Would like to be ruled out for UTI.      Past Medical History:  Diagnosis Date   Gastroesophageal reflux disease 04/27/2017   History of atypical skin mole 04/27/2017   Kidney stones 04/27/2017   Myalgia 04/27/2017   Paresthesia 04/27/2017   PONV (postoperative nausea and vomiting)    Stiffness of joint 04/27/2017   Vaginal Pap smear, abnormal     Patient Active Problem List   Diagnosis Date Noted   Postpartum care following cesarean delivery 4/21 08/07/2018   Previous cesarean delivery, repeat C/S 08/07/2018   Status post repeat low transverse cesarean section 08/07/2018   Gastroesophageal reflux disease 04/27/2017   History of atypical skin mole 04/27/2017   Kidney stones 04/27/2017   Myalgia 04/27/2017   Paresthesia 04/27/2017   Stiffness of joint 04/27/2017    Past Surgical History:  Procedure Laterality Date   CESAREAN SECTION  2017   CESAREAN SECTION N/A 08/07/2018   Procedure: Repeat CESAREAN SECTION;  Surgeon: Noland Fordyce, MD;  [facility]: [facility];  Service: Obstetrics;  Laterality: N/A;  EDD: 08/14/18   LEEP      OB History    Gravida  2   Para  2   Term  1   Preterm      AB      Living  1     SAB      IAB      Ectopic      Multiple  0   Live Births  1            Home Medications    Prior to Admission  medications   Medication Sig Start Date End Date Taking? Authorizing Provider  ibuprofen (ADVIL) 800 MG tablet Take 1 tablet (800 mg total) by mouth every 8 (eight) hours. 08/09/18   Marlinda Mike., CNM  iron polysaccharides (NIFEREX) 150 MG capsule Take 1 capsule (150 mg total) by mouth daily. 08/10/18   Marlinda Mike., CNM  magnesium oxide (MAG-OX) 400 (241.3 Mg) MG tablet Take 1 tablet (400 mg total) by mouth daily. 08/10/18   Marlinda Mike., CNM  Prenatal MV & Min w/FA-DHA (PRENATAL ADULT GUMMY/DHA/FA PO) Take 2 tablets by mouth daily. Vitafusion prenatal    [provider]  ranitidine (ZANTAC) 150 MG tablet Take 150 mg by mouth 2 (two) times daily. 06/29/18   [provider]    Family History Family History  Problem Relation Age of Onset   Hashimoto's thyroiditis Mother    Depression Mother    Hypertension Father    Hashimoto's thyroiditis Sister    Depression Sister    Diabetes Maternal Uncle    Breast cancer Paternal Aunt    Heart disease Maternal Grandfather    Heart attack Maternal Grandfather    Heart disease Paternal Grandfather    Heart attack Paternal Grandfather  Prostate cancer Neg Hx    Bladder Cancer Neg Hx    Kidney cancer Neg Hx     Social History Social History   Tobacco Use   Smoking status: Never Smoker   Smokeless tobacco: Never Used  Vaping Use   Vaping Use: Never used  Substance Use Topics   Alcohol use: Yes   Drug use: No     Allergies   Iodine   Review of Systems Review of Systems   Physical Exam Triage Vital Signs ED Triage Vitals [04/13/20 1349]  Enc Vitals Group     BP 105/73     Pulse Rate 76     Resp 16     Temp 98 F (36.7 C)     Temp Source Oral     SpO2 98 %     Weight      Height      Head Circumference      Peak Flow      Pain Score      Pain Loc      Pain Edu?      Excl. in GC?    No data found.  Updated Vital Signs BP 105/73 (BP Location: Left Arm)    Pulse 76    Temp 98  F (36.7 C) (Oral)    Resp 16    LMP 04/06/2020    SpO2 98%   Visual Acuity Right Eye Distance:   Left Eye Distance:   Bilateral Distance:    Right Eye Near:   Left Eye Near:    Bilateral Near:     Physical Exam Vitals and nursing note reviewed.  Constitutional:      General: She is not in acute distress.    Appearance: Normal appearance. She is not ill-appearing, toxic-appearing or diaphoretic.  HENT:     Head: Normocephalic.     Right Ear: Tympanic membrane and ear canal normal.     Left Ear: Tympanic membrane and ear canal normal.     Nose: Nose normal.     Mouth/Throat:     Pharynx: Oropharynx is clear. Posterior oropharyngeal erythema present.  Eyes:     Conjunctiva/sclera: Conjunctivae normal.  Cardiovascular:     Rate and Rhythm: Normal rate and regular rhythm.  Pulmonary:     Effort: Pulmonary effort is normal.     Breath sounds: Normal breath sounds.  Abdominal:     Tenderness: There is no right CVA tenderness or left CVA tenderness.  Musculoskeletal:        General: Normal range of motion.     Cervical back: Normal range of motion.  Skin:    General: Skin is warm and dry.     Findings: No rash.  Neurological:     Mental Status: She is alert.  Psychiatric:        Mood and Affect: Mood normal.      UC Treatments / Results  Labs (all labs ordered are listed, but only abnormal results are displayed) Labs Reviewed  POCT URINALYSIS DIP (MANUAL ENTRY) - Abnormal; Notable for the following components:      Result Value   Blood, UA trace-intact (*)    All other components within normal limits  COVID-19, FLU A+B NAA    EKG   Radiology DG Chest 2 View  Result Date: 04/13/2020 CLINICAL DATA:  Cough and fever with shortness of breath EXAM: CHEST - 2 VIEW COMPARISON:  None. FINDINGS: The lungs are clear. The heart size and pulmonary vascularity  are normal. No adenopathy. No bone lesions. IMPRESSION: Lungs clear.  Cardiac silhouette normal. Electronically  Signed   By: Bretta Bang III M.D.   On: 04/13/2020 13:43    Procedures Procedures (including critical care time)  Medications Ordered in UC Medications  dexamethasone (DECADRON) injection 10 mg (10 mg Intramuscular Given 04/13/20 1400)    Initial Impression / Assessment and Plan / UC Course  I have reviewed the triage vital signs and the nursing notes.  Pertinent labs & imaging results that were available during my care of the patient were reviewed by me and considered in my medical decision making (see chart for details).     Viral illness Chest x ray negative covid and flu test pending.  OTC medicines as needed Steroid injection given here for lung and throat inflammation.  Nothing concerning on exam.   Hematuria Urine with trace hgb. Sending for culture.  Push fluids.  Follow up as needed for continued or worsening symptoms   Final Clinical Impressions(s) / UC Diagnoses   Final diagnoses:  Viral illness  Hematuria, unspecified type     Discharge Instructions     Your x-ray was normal. Your Covid and flu swabs are pending.  Your urine did have trace blood but no signs of a urinary tract infection. You can continue over-the-counter medicines as needed for your symptoms Follow up as needed for continued or worsening symptoms     ED Prescriptions    None     PDMP not reviewed this encounter.   Janace Aris, NP 04/14/20 [phone]

## 2020-06-19 DIAGNOSIS — D2262 Melanocytic nevi of left upper limb, including shoulder: Secondary | ICD-10-CM | POA: Diagnosis not present

## 2020-06-19 DIAGNOSIS — D225 Melanocytic nevi of trunk: Secondary | ICD-10-CM | POA: Diagnosis not present

## 2020-06-19 DIAGNOSIS — D2271 Melanocytic nevi of right lower limb, including hip: Secondary | ICD-10-CM | POA: Diagnosis not present

## 2020-06-19 DIAGNOSIS — D2261 Melanocytic nevi of right upper limb, including shoulder: Secondary | ICD-10-CM | POA: Diagnosis not present

## 2020-07-01 DIAGNOSIS — Z3202 Encounter for pregnancy test, result negative: Secondary | ICD-10-CM | POA: Diagnosis not present

## 2020-07-01 DIAGNOSIS — Z30430 Encounter for insertion of intrauterine contraceptive device: Secondary | ICD-10-CM | POA: Diagnosis not present

## 2020-07-20 DIAGNOSIS — F411 Generalized anxiety disorder: Secondary | ICD-10-CM | POA: Diagnosis not present

## 2020-07-29 DIAGNOSIS — Z30431 Encounter for routine checking of intrauterine contraceptive device: Secondary | ICD-10-CM | POA: Diagnosis not present

## 2020-07-29 DIAGNOSIS — N943 Premenstrual tension syndrome: Secondary | ICD-10-CM | POA: Diagnosis not present

## 2020-08-26 DIAGNOSIS — F411 Generalized anxiety disorder: Secondary | ICD-10-CM | POA: Diagnosis not present

## 2020-10-01 DIAGNOSIS — F411 Generalized anxiety disorder: Secondary | ICD-10-CM | POA: Diagnosis not present

## 2020-10-22 DIAGNOSIS — T8383XA Hemorrhage of genitourinary prosthetic devices, implants and grafts, initial encounter: Secondary | ICD-10-CM | POA: Diagnosis not present

## 2020-10-22 DIAGNOSIS — R5383 Other fatigue: Secondary | ICD-10-CM | POA: Diagnosis not present

## 2020-10-22 DIAGNOSIS — Z6822 Body mass index (BMI) 22.0-22.9, adult: Secondary | ICD-10-CM | POA: Diagnosis not present

## 2020-10-22 DIAGNOSIS — Z01419 Encounter for gynecological examination (general) (routine) without abnormal findings: Secondary | ICD-10-CM | POA: Diagnosis not present

## 2020-10-22 DIAGNOSIS — Z124 Encounter for screening for malignant neoplasm of cervix: Secondary | ICD-10-CM | POA: Diagnosis not present

## 2020-11-20 DIAGNOSIS — F411 Generalized anxiety disorder: Secondary | ICD-10-CM | POA: Diagnosis not present

## 2021-01-01 DIAGNOSIS — F411 Generalized anxiety disorder: Secondary | ICD-10-CM | POA: Diagnosis not present

## 2021-01-06 DIAGNOSIS — N926 Irregular menstruation, unspecified: Secondary | ICD-10-CM | POA: Diagnosis not present

## 2021-01-06 DIAGNOSIS — F411 Generalized anxiety disorder: Secondary | ICD-10-CM | POA: Diagnosis not present

## 2021-01-06 DIAGNOSIS — L659 Nonscarring hair loss, unspecified: Secondary | ICD-10-CM | POA: Diagnosis not present

## 2021-02-03 DIAGNOSIS — F411 Generalized anxiety disorder: Secondary | ICD-10-CM | POA: Diagnosis not present

## 2021-02-05 DIAGNOSIS — F411 Generalized anxiety disorder: Secondary | ICD-10-CM | POA: Diagnosis not present

## 2021-03-23 DIAGNOSIS — Z23 Encounter for immunization: Secondary | ICD-10-CM | POA: Diagnosis not present

## 2021-04-22 DIAGNOSIS — F411 Generalized anxiety disorder: Secondary | ICD-10-CM | POA: Diagnosis not present

## 2021-05-15 DIAGNOSIS — Z03818 Encounter for observation for suspected exposure to other biological agents ruled out: Secondary | ICD-10-CM | POA: Diagnosis not present

## 2021-05-15 DIAGNOSIS — M545 Low back pain, unspecified: Secondary | ICD-10-CM | POA: Diagnosis not present

## 2021-05-15 DIAGNOSIS — J029 Acute pharyngitis, unspecified: Secondary | ICD-10-CM | POA: Diagnosis not present

## 2021-05-15 DIAGNOSIS — R509 Fever, unspecified: Secondary | ICD-10-CM | POA: Diagnosis not present

## 2021-05-15 DIAGNOSIS — J039 Acute tonsillitis, unspecified: Secondary | ICD-10-CM | POA: Diagnosis not present

## 2021-05-18 DIAGNOSIS — Z0000 Encounter for general adult medical examination without abnormal findings: Secondary | ICD-10-CM | POA: Diagnosis not present

## 2021-05-18 DIAGNOSIS — R140 Abdominal distension (gaseous): Secondary | ICD-10-CM | POA: Diagnosis not present

## 2021-05-18 DIAGNOSIS — Z13220 Encounter for screening for lipoid disorders: Secondary | ICD-10-CM | POA: Diagnosis not present

## 2021-05-18 DIAGNOSIS — R319 Hematuria, unspecified: Secondary | ICD-10-CM | POA: Diagnosis not present

## 2021-05-18 DIAGNOSIS — M6208 Separation of muscle (nontraumatic), other site: Secondary | ICD-10-CM | POA: Diagnosis not present

## 2021-05-18 DIAGNOSIS — Z8261 Family history of arthritis: Secondary | ICD-10-CM | POA: Diagnosis not present

## 2021-05-18 DIAGNOSIS — Z131 Encounter for screening for diabetes mellitus: Secondary | ICD-10-CM | POA: Diagnosis not present

## 2021-05-18 DIAGNOSIS — R635 Abnormal weight gain: Secondary | ICD-10-CM | POA: Diagnosis not present

## 2021-06-10 DIAGNOSIS — F411 Generalized anxiety disorder: Secondary | ICD-10-CM | POA: Diagnosis not present

## 2021-06-27 ENCOUNTER — Telehealth: Payer: Federal, State, Local not specified - PPO | Admitting: Physician Assistant

## 2021-06-27 DIAGNOSIS — R0981 Nasal congestion: Secondary | ICD-10-CM | POA: Diagnosis not present

## 2021-06-27 MED ORDER — FLUCONAZOLE 150 MG PO TABS: 150.0000 mg | ORAL_TABLET | Freq: Once | ORAL | 0 refills | Status: AC

## 2021-06-27 MED ORDER — DOXYCYCLINE HYCLATE 100 MG PO TABS: 100.0000 mg | ORAL_TABLET | Freq: Two times a day (BID) | ORAL | 0 refills | Status: AC

## 2021-06-27 NOTE — Progress Notes (Signed)
?Virtual Visit Consent  ? ?Nicole Mcgrath, you are scheduled for a virtual visit with a [facility] provider today.   ?  ?Just as with appointments in the office, your consent must be obtained to participate.  Your consent will be active for this visit and any virtual visit you may have with one of our providers in the next 365 days.   ?  ?If you have a MyChart account, a copy of this consent can be sent to you electronically.  All virtual visits are billed to your insurance company just like a traditional visit in the office.   ? ?As this is a virtual visit, video technology does not allow for your provider to perform a traditional examination.  This may limit your provider's ability to fully assess your condition.  If your provider identifies any concerns that need to be evaluated in person or the need to arrange testing (such as labs, EKG, etc.), we will make arrangements to do so.   ?  ?Although advances in technology are sophisticated, we cannot ensure that it will always work on either your end or our end.  If the connection with a video visit is poor, the visit may have to be switched to a telephone visit.  With either a video or telephone visit, we are not always able to ensure that we have a secure connection.    ? ?I need to obtain your verbal consent now.   Are you willing to proceed with your visit today?  ?  ?Nicole Mcgrath has provided verbal consent on 06/27/2021 for a virtual visit (video or telephone). ?  ?Inda Coke, PA  ? ?Date: 06/27/2021 4:12 PM ? ? ?Virtual Visit via Video Note  ? ?Nicole Mcgrath, connected with  Nicole Mcgrath  ([id number], 08/02/1985) on 06/27/21 at  4:15 PM EDT by a video-enabled telemedicine application and verified that I am speaking with the correct person using two identifiers. ? ?Location: ?Patient: Virtual Visit Location Patient: Home ?Provider: Virtual Visit Location Provider: Office/Clinic ?  ?I discussed the limitations of evaluation and  management by telemedicine and the availability of in person appointments. The patient expressed understanding and agreed to proceed.   ? ?History of Present Illness: ?Nicole Mcgrath is a 35 y.o. who identifies as a female who was assigned female at birth, and is being seen today for sinusitis. ? ?Wednesday started feeling bad. Developed fever of 101 and severe nasal congestion. Husband had sinus infection dx on Friday. She is currently taking Mucinex. Feels like her ears are clogged and has fluid in her ears. Drinking plenty of fluids. Appetite and tastes are off. Denies: severe cough, CP, SOB. ? ?Denies concerns for pregnancy. ? ?HPI: HPI  ?Problems:  ?Patient Active Problem List  ? Diagnosis Date Noted  ? Postpartum care following cesarean delivery 4/21 08/07/2018  ? Previous cesarean delivery, repeat C/S 08/07/2018  ? Status post repeat low transverse cesarean section 08/07/2018  ? Gastroesophageal reflux disease 04/27/2017  ? History of atypical skin mole 04/27/2017  ? Kidney stones 04/27/2017  ? Myalgia 04/27/2017  ? Paresthesia 04/27/2017  ? Stiffness of joint 04/27/2017  ?  ?Allergies:  ?Allergies  ?Allergen Reactions  ? Iodine Rash  ?  rash post CS  ? ?Medications:  ?Current Outpatient Medications:  ?  ibuprofen (ADVIL) 800 MG tablet, Take 1 tablet (800 mg total) by mouth every 8 (eight) hours., Disp: 30 tablet, Rfl: 0 ?  iron polysaccharides (NIFEREX) 150 MG capsule,  Take 1 capsule (150 mg total) by mouth daily., Disp: 45 capsule, Rfl: 0 ?  magnesium oxide (MAG-OX) 400 (241.3 Mg) MG tablet, Take 1 tablet (400 mg total) by mouth daily., Disp: 45 tablet, Rfl: 0 ?  Prenatal MV & Min w/FA-DHA (PRENATAL ADULT GUMMY/DHA/FA PO), Take 2 tablets by mouth daily. Vitafusion prenatal, Disp: , Rfl:  ?  ranitidine (ZANTAC) 150 MG tablet, Take 150 mg by mouth 2 (two) times daily., Disp: , Rfl:  ? ?Observations/Objective: ?Patient is well-developed, well-nourished in no acute distress.  ?Resting comfortably  at home.   ?Head is normocephalic, atraumatic.  ?No labored breathing.  ?Speech is clear and coherent with logical content.  ?Patient is alert and oriented at baseline.  ? ?Assessment and Plan: ?1. Sinus congestion ?No red flags on exam/discussion.  Will initiate doxycycline per orders. Will also rx diflucan for hx of abx-associated yeast infection. Discussed taking medications as prescribed. Reviewed return precautions including worsening fever, SOB, worsening cough or other concerns. Push fluids and rest. I recommend that patient follow-up if symptoms worsen or persist despite treatment x 7-10 days, sooner if needed. ? ?Follow Up Instructions: ?I discussed the assessment and treatment plan with the patient. The patient was provided an opportunity to ask questions and all were answered. The patient agreed with the plan and demonstrated an understanding of the instructions.  A copy of instructions were sent to the patient via MyChart unless otherwise noted below.  ? ?The patient was advised to call back or seek an in-person evaluation if the symptoms worsen or if the condition fails to improve as anticipated. ? ?Time:  ?I spent 5-10 minutes with the patient via telehealth technology discussing the above problems/concerns.   ? ?Inda Coke, PA ? ?

## 2021-07-22 DIAGNOSIS — F411 Generalized anxiety disorder: Secondary | ICD-10-CM | POA: Diagnosis not present

## 2021-07-29 DIAGNOSIS — D485 Neoplasm of uncertain behavior of skin: Secondary | ICD-10-CM | POA: Diagnosis not present

## 2021-07-29 DIAGNOSIS — D225 Melanocytic nevi of trunk: Secondary | ICD-10-CM | POA: Diagnosis not present

## 2021-07-29 DIAGNOSIS — D2262 Melanocytic nevi of left upper limb, including shoulder: Secondary | ICD-10-CM | POA: Diagnosis not present

## 2021-07-29 DIAGNOSIS — D2261 Melanocytic nevi of right upper limb, including shoulder: Secondary | ICD-10-CM | POA: Diagnosis not present

## 2021-08-02 ENCOUNTER — Other Ambulatory Visit: Payer: Self-pay

## 2021-08-02 ENCOUNTER — Ambulatory Visit: Payer: Federal, State, Local not specified - PPO | Attending: Physician Assistant

## 2021-08-02 DIAGNOSIS — M6281 Muscle weakness (generalized): Secondary | ICD-10-CM | POA: Insufficient documentation

## 2021-08-02 DIAGNOSIS — R278 Other lack of coordination: Secondary | ICD-10-CM | POA: Diagnosis not present

## 2021-08-02 DIAGNOSIS — R2689 Other abnormalities of gait and mobility: Secondary | ICD-10-CM | POA: Insufficient documentation

## 2021-08-02 DIAGNOSIS — N3941 Urge incontinence: Secondary | ICD-10-CM | POA: Diagnosis not present

## 2021-08-02 NOTE — Patient Instructions (Signed)
Access Code: [email or web address] ?URL: [email or web address] ?Date: 08/02/2021 ?Prepared by: Zerita Boers ? ?Exercises ?- Standing Pelvic Tilt  - 1 x daily - 7 x weekly - 1 sets - 5 reps ?- Supine Diaphragmatic Breathing  - 1 x daily - 7 x weekly - 1 sets - 5 reps ? ?TOILET POSTURE: ?Urination: feet flat, lean forward with forearms on legs to fully empty bladder. ?Bowel movement: place feet flat on Squatty Potty or stool so knees are higher than hips, lean forward to relax pelvic floor in order to avoid strain. ? ?

## 2021-08-02 NOTE — Therapy (Signed)
[place] [facility] ?[address] ?[place], [place], [address] ?Phone: [phone]   Fax:  [phone] ? ?Physical Therapy Evaluation ? ?Patient Details  ?Name: Nicole Mcgrath ?MRN: [id number] ?Date of Birth: 1985/09/14 ?Referring Provider (PT): Dr. Merlinda Frederick ? ? ?Encounter Date: 08/02/2021 ? ? PT End of Session - 08/02/21 1554   ? ? Visit Number 1   ? Number of Visits 9   ? Date for PT Re-Evaluation 10/01/21   ? Authorization Type [employer] 50 visits PT/OT/Speech   ? Progress Note Due on Visit 10   ? PT Start Time 1004   ? PT Stop Time 1057   ? PT Time Calculation (min) 53 min   ? Activity Tolerance Patient tolerated treatment well   ? Behavior During Therapy [facility] for tasks assessed/performed   ? ?  ?  ? ?  ? ? ?Past Medical History:  ?Diagnosis Date  ? Gastroesophageal reflux disease 04/27/2017  ? History of atypical skin mole 04/27/2017  ? Kidney stones 04/27/2017  ? Myalgia 04/27/2017  ? Paresthesia 04/27/2017  ? PONV (postoperative nausea and vomiting)   ? Stiffness of joint 04/27/2017  ? Vaginal Pap smear, abnormal   ? ? ?Past Surgical History:  ?Procedure Laterality Date  ? CESAREAN SECTION  2017  ? CESAREAN SECTION N/A 08/07/2018  ? Procedure: Repeat CESAREAN SECTION;  Surgeon: Noland Fordyce, MD;  Location: [facility] LD [facility];  Service: Obstetrics;  Laterality: N/A;  EDD: 08/14/18  ? LEEP    ? ? ?There were no vitals filed for this visit. ? ? ? Subjective Assessment - 08/02/21 1011   ? ? Subjective Pt reported she had two children in 2017 and 2020 and noticed ab separation and they were born super fast. They were both c-sections (first was stuck in the vaginal canal and had to be pushed back into uterus). Urinary: hx of kidney stones and recurrent UTIs. She can't hold urine as much as prior to children. She does have intermittent urge incontinence, worse in the morning-does not wear a pad or pantyliner. Leaks multiple times per week. Bowel incontinence: once a day.  Sexual function: pt is sexually active, and has IUD. Pt intermittenly feels IUD. Core stability: hx of diastasis recti. Pt reported she sat in "W" position as a child and still toes in during amb.   ? Patient Stated Goals "To improve core strength and incontinence." "Learn how to improve walking."   ? Currently in Pain? No/denies   ? ?  ?  ? ?  ? ? ? ? ? [facility] PT Assessment - 08/02/21 1019   ? ?  ? Assessment  ? Medical Diagnosis Separation of muscle, diastasis recti   ? Referring Provider (PT) Dr. Merlinda Frederick   ? Onset Date/Surgical Date --   after first birth (2017)  ? Prior Therapy none   ?  ? Precautions  ? Precautions None   ?  ? Restrictions  ? Weight Bearing Restrictions No   ?  ? Balance Screen  ? Has the patient fallen in the past 6 months No   ?  ? Home Environment  ? Living Environment Private residence   ? Living Arrangements Spouse/significant other;Children   ? Available Help at Discharge Available PRN/intermittently   ? Type of Home House   ? Home Access Stairs to enter   ? Entrance Stairs-Number of Steps 1   garage  ? Entrance Stairs-Rails None   ? Home Layout Two level;Bed/bath upstairs   ?  Alternate Level Stairs-Number of Steps 15   ? Home Equipment None   ?  ? Prior Function  ? Level of Independence Independent   ? Vocation Part time employment   ? Vocation Requirements on the computer [employer]), sitting   pt was a Runner, broadcasting/film/video  ? Leisure [place], [place], skiing, going to concerts, travelling, going to [employer] sports game, going to the gym 3-4 days/week-functional strength training and cardio and some classes (spin)   ? ?  ?  ? ?  ? ? ? ? ?Pelvic Floor Physical Therapy Evaluation and Assessment ? ?Screenings: ?Red Flags:  no  ?Have you had any night sweats? ?Unexplained weight loss? ?Saddle anesthesia?  ?Unexplained changes in bowel or bladder changes? ? ? ?Pt has been tested for autoimmune disease but testing has been negative but does have joint stiffness and N/T in extremities if sleeping on  side.  ? ?OBJECTIVE ? ?Posture/Observations:  ?Sitting: LEs crossed at knees and ankles ?Standing: incr. Ant. Pelvic tilt and lx spine lordosis,  ? ?Supine: L iliac crest elevated but no LLD noted ? ? ?Range of Motion/Flexibilty:  ?Spine: WNL ?Hips: ? ? ?Strength/MMT:  ?LE MMT  ?B knee ext: 5/5 ?B knee flex: 4/5 ?LE MMT Left Right  ?Hip flex:  (L2) 5/5 5/5  ?Hip ext: 5/5 5/5  ?Hip abd: 4-/5 4-/5  ?Hip add: 4-/5 4-/5  ?Hip IR /5 /5  ?Hip ER /5 /5  ? ? ? ?Abdominal:  ?Palpation: 1.5 fingers width 2" sup. To umbilicus progressing to 3.5-3 fingers width at umbilicus. ?Diastasis: ? ?Pelvic Floor ?External Exam: ? ?Through clothing: yes ?Ischial tuberosities: no TTP ?Palpation for pelvic floor contraction: pt bulged when asked to contract ?Coccyx: WNL ? ? ? ? ? ? ?Objective measurements completed on examination: See above findings.  ? ? ? ? ?NMR: ?Access Code: [email or web address] ?URL: [email or web address] ?Date: 08/02/2021 ?Prepared by: Zerita Boers ? ?Exercises ?- Standing Pelvic Tilt  - 1 x daily - 7 x weekly - 1 sets - 5 reps performed in hooklying and standing against wall and without wall ?- Supine Diaphragmatic Breathing  - 1 x daily - 7 x weekly - 1 sets - 5 reps ? ?TOILET POSTURE: ?Urination: feet flat, lean forward with forearms on legs to fully empty bladder. ?Bowel movement: place feet flat on Squatty Potty or stool so knees are higher than hips, lean forward to relax pelvic floor in order to avoid strain. ?Cues and demo for all activities. Performed with S for safety. ? ? ? ? ? ? ? ? ? PT Education - 08/02/21 1552   ? ? Education Details PT educated pt on exam findings, PT POC, duration and frequency. PT educated pt on the importance of proper posture and toileting posture in order to fully empty bladder and decr. straining during bowel movements. PT initiated HEP.   ? Person(s) Educated Patient   ? Methods Explanation;Demonstration;Verbal cues;Handout   ? Comprehension Returned demonstration;Need  further instruction;Verbalized understanding   ? ?  ?  ? ?  ? ? ? PT Short Term Goals - 08/02/21 1610   ? ?  ? PT SHORT TERM GOAL #1  ? Title Pt will be IND in HEP to improve strength, flexibility and balance.   ? Time 4   ? Period Weeks   ? Status New   ? Target Date 08/30/21   ?  ? PT SHORT TERM GOAL #2  ? Title Pt will demonstrate decr. ant. pelvic tilt, equal weight bearing in LEs  in seated and standing posture to improve coordination of core, PFM, and hip musculature.   ? Time 4   ? Period Weeks   ? Status New   ? Target Date 08/30/21   ?  ? PT SHORT TERM GOAL #3  ? Title Have pt perform FOTO and write goal prn.   ? Time 4   ? Period Weeks   ? Status New   ? Target Date 08/30/21   ?  ? PT SHORT TERM GOAL #4  ? Title Pt will be able to improve PFM coordination to contract and relax PFM in order to reduce urge incontinence.   ? Baseline Leaks a few times a week.   ? Time 4   ? Period Weeks   ? Status New   ? Target Date 08/30/21   ?  ? PT SHORT TERM GOAL #5  ? Title Formally assess gait and write goal prn.   ? Time 4   ? Period Weeks   ? Status New   ? Target Date 08/30/21   ? ?  ?  ? ?  ? ? ? ? PT Long Term Goals - 08/02/21 1614   ? ?  ? PT LONG TERM GOAL #1  ? Title Pt will demonstrate proper posture during strength training activities at gym to improve intra-abdominal pressure during activities.   ? Time 8   ? Period Weeks   ? Status New   ? Target Date 09/27/21   ?  ? PT LONG TERM GOAL #2  ? Title Pt will demonstrate improved diastasis recti to </=1 fingers width during assessment to improve intra-abdominal pressure, strength, and coordination.   ? Baseline 1.5-3 fingers width superior to umbilicus (2" sup. to umbilicus, progressing to 2.5-3 fingers width over umbilicus)   ? Time 8   ? Period Weeks   ? Status New   ? Target Date 09/27/21   ?  ? PT LONG TERM GOAL #3  ? Title Perform internal muscle assessment and write goals prn.   ? Time 8   ? Period Weeks   ? Status New   ? Target Date 09/27/21   ? ?  ?  ? ?   ? ? ? ? ? ? ? ? ? Plan - 08/02/21 1600   ? ? Clinical Impression Statement Pt is a pleasant 36 y/o female presennting to pelvic health OPPT for diastasis recti and urge incontinence. Pt's PMH is significant for

## 2021-08-10 ENCOUNTER — Ambulatory Visit: Payer: Federal, State, Local not specified - PPO

## 2021-08-10 ENCOUNTER — Other Ambulatory Visit: Payer: Self-pay

## 2021-08-10 DIAGNOSIS — M6281 Muscle weakness (generalized): Secondary | ICD-10-CM | POA: Diagnosis not present

## 2021-08-10 DIAGNOSIS — R278 Other lack of coordination: Secondary | ICD-10-CM

## 2021-08-10 DIAGNOSIS — N3941 Urge incontinence: Secondary | ICD-10-CM

## 2021-08-10 DIAGNOSIS — R2689 Other abnormalities of gait and mobility: Secondary | ICD-10-CM

## 2021-08-10 NOTE — Patient Instructions (Signed)
Access Code: [email or web address] ?URL: [email or web address] ?Date: 08/10/2021 ?Prepared by: Zerita Boers ? ?Exercises ?- Standing Pelvic Tilt  - 1 x daily - 7 x weekly - 1 sets - 5 reps ?- Supine Diaphragmatic Breathing  - 1 x daily - 7 x weekly - 1 sets - 5 reps ?- Supine Figure 4 Piriformis Stretch  - 2-3 x daily - 7 x weekly - 1 sets - 3 reps - 30 hold ?- Supine Piriformis Stretch with Leg Straight  - 1 x daily - 7 x weekly - 3 sets - 10 reps ?- Clamshell  - 1 x daily - 3 x weekly - 3 sets - 10 reps ? ?Patient Education ?- Scar Massage ?

## 2021-08-10 NOTE — Therapy (Addendum)
[facility] ?[place], [place], [address] ?Phone: [phone]   Fax:  [phone] ? ?Physical Therapy Treatment ? ?Patient Details  ?Name: Nicole Mcgrath ?MRN: [id number] ?Date of Birth: 1985-11-10 ?Referring Provider (PT): Dr. Carrie Mew ? ? ?Encounter Date: 08/10/2021 ? ? PT End of Session - 08/10/21 1158   ? ? Visit Number 2   ? Number of Visits 9   ? Date for PT Re-Evaluation 10/01/21   ? Authorization Type BCBS Federal 50 visits PT/OT/Speech   ? Progress Note Due on Visit 10   ? PT Start Time 1004   ? PT Stop Time 1058   ? PT Time Calculation (min) 54 min   ? Activity Tolerance Patient tolerated treatment well   ? Behavior During Therapy [facility] for tasks assessed/performed   ? ?  ?  ? ?  ? ? ?Past Medical History:  ?Diagnosis Date  ? Gastroesophageal reflux disease 04/27/2017  ? History of atypical skin mole 04/27/2017  ? Kidney stones 04/27/2017  ? Myalgia 04/27/2017  ? Paresthesia 04/27/2017  ? PONV (postoperative nausea and vomiting)   ? Stiffness of joint 04/27/2017  ? Vaginal Pap smear, abnormal   ? ? ?Past Surgical History:  ?Procedure Laterality Date  ? CESAREAN SECTION  2017  ? CESAREAN SECTION N/A 08/07/2018  ? Procedure: Repeat CESAREAN SECTION;  Surgeon: Aloha Gell, MD;  Location: [place] LD ORS;  Service: Obstetrics;  Laterality: N/A;  EDD: 08/14/18  ? LEEP    ? ? ?There were no vitals filed for this visit. ? ? Subjective Assessment - 08/10/21 1006   ? ? Subjective Pt reported she lost her exercise login as she was out of town. Pt reported she's doing E2M (eager to motivate) meals and drinking a lot of water. Pt has changed her toileting posture, no leakage.   ? Patient Stated Goals "To improve core strength and incontinence." "Learn how to improve walking."   ? Currently in Pain? No/denies   ? ?  ?  ? ?  ? ? ? ? ?NMR: ?Access Code: [email or web address] ?URL: [email or web address] ?Date: 08/10/2021 ?Prepared by: Geoffry Paradise ? ?Exercises ?- Standing Pelvic Tilt  - 1 x daily - 7 x weekly - 1 sets - 5 reps review ?- Supine Diaphragmatic Breathing  - 1 x daily - 7 x weekly - 1 sets - 5 reps review ?- Supine Figure 4 Piriformis Stretch  - 2-3 x daily - 7 x weekly - 1 sets - 3 reps - 30 hold  ?- Supine Piriformis Stretch with Leg Straight  - 1 x daily - 7 x weekly - 3 sets - 10 reps ?- Clamshell  - 1 x daily - 3 x weekly - 3 sets - 10 reps BLEs no band. ? ?Patient Education ?- Scar Massage ?Performed with demo and cues for proper technique. ?Pt amb. 4x10' and 2x75' for PT to analyze gait. ? ? ? ? ? ? ? ? ? ? ? ? ? ? [facility] Adult PT Treatment/Exercise - 08/10/21 1152   ? ?  ? Manual Therapy  ? Manual Therapy Soft tissue mobilization;Passive ROM;Joint mobilization   ? Manual therapy comments Pt reported she felt less tension after manual therapy.   ? Joint Mobilization PT assessed B ASIS, B PSIS, and sacrum--all WNL except for R SI joint mobility during AP pressure to R hip joint but likely 2/2 tight hip rotators. PT assesed pt's B ankle DF/PF and metatarsals and phalanx  ROM--WNL.   ? Soft tissue mobilization PT performed STM to L quadratus lumborum and L paraspinals with pt in R sidelying. PT performed scar mobilization to pt's c-section scar to reduce adhesions.   ? Passive ROM PT performed B piriformis stretches.   ? ?  ?  ? ?  ? ? ? ? ? ? ? ? ?Self care: ? PT Education - 08/10/21 1156   ? ? Education Details PT reviewed and progressed HEP. PT discussed exam findings during gait assessment and jt. mobility assessment. PT educated pt on proper running shoes and how weak hip ER can lead to incr. hip, knee and ankle jt. pain-pt reported this after last run. PT educated pt on carrying her children in center of body vs. one hip and the importance of equal wt. over BLE in stance. PT educated pt on midfoot strike during run.   ? Person(s) Educated Patient   ? Methods Explanation;Demonstration;Verbal cues;Handout   ? Comprehension Returned  demonstration;Need further instruction;Verbalized understanding   ? ?  ?  ? ?  ? ? ? PT Short Term Goals - 08/02/21 1610   ? ?  ? PT SHORT TERM GOAL #1  ? Title Pt will be IND in HEP to improve strength, flexibility and balance.   ? Time 4   ? Period Weeks   ? Status New   ? Target Date 08/30/21   ?  ? PT SHORT TERM GOAL #2  ? Title Pt will demonstrate decr. ant. pelvic tilt, equal weight bearing in LEs in seated and standing posture to improve coordination of core, PFM, and hip musculature.   ? Time 4   ? Period Weeks   ? Status New   ? Target Date 08/30/21   ?  ? PT SHORT TERM GOAL #3  ? Title Have pt perform FOTO and write goal prn.   ? Time 4   ? Period Weeks   ? Status New   ? Target Date 08/30/21   ?  ? PT SHORT TERM GOAL #4  ? Title Pt will be able to improve PFM coordination to contract and relax PFM in order to reduce urge incontinence.   ? Baseline Leaks a few times a week.   ? Time 4   ? Period Weeks   ? Status New   ? Target Date 08/30/21   ?  ? PT SHORT TERM GOAL #5  ? Title Formally assess gait and write goal prn.   ? Time 4   ? Period Weeks   ? Status New   ? Target Date 08/30/21   ? ?  ?  ? ?  ? ? ? ? PT Long Term Goals - 08/10/21 1202   ? ?  ? PT LONG TERM GOAL #1  ? Title Pt will demonstrate proper posture during strength training activities at gym to improve intra-abdominal pressure during activities.   ? Time 8   ? Period Weeks   ? Status New   ? Target Date 09/27/21   ?  ? PT LONG TERM GOAL #2  ? Title Pt will demonstrate improved diastasis recti to </=1 fingers width during assessment to improve intra-abdominal pressure, strength, and coordination.   ? Baseline 1.5-3 fingers width superior to umbilicus (2" sup. to umbilicus, progressing to 2.5-3 fingers width over umbilicus)   ? Time 8   ? Period Weeks   ? Status New   ? Target Date 09/27/21   ?  ? PT LONG TERM  GOAL #3  ? Title Perform internal muscle assessment and write goals prn.   ? Time 8   ? Period Weeks   ? Status New   ? Target Date  09/27/21   ?  ? PT LONG TERM GOAL #4  ? Title Pt will amb. 100' IND with improve hip ER (toe out), decr. B foot pronation, and posture to decr. leakage and pain.   ? Time 8   ? Period Weeks   ? Status New   ? Target Date 10/01/21   ? ?  ?  ? ?  ? ? ? ? ? ? ? ? Plan - 08/10/21 1158   ? ? Clinical Impression Statement Pt's L iliac crest elevation likely 2/2 incr. tension in L quadratus lumborum, which decr. after manual therapy. Pt noted to have incr. scarring over R medial and lateral quadrant of c-section scar. PT performed scar mobilization and educated pt on how to perform to reduce adhesions. Pt noted to have incr. B hip rotator tightnes (R>L) which improved with stretches and manual therapy. Pt's B toe in during gait most likely 2/2 osteokinematics and tight hip musculature, which can impact pelvic floor and core musculature. Therefore, pt would continue to benefit from inter-regional approach to reduce impairments listed above and leakage during all ADLs.   ? Comorbidities hx of kidney stones and UTIs, anxiety, GERD, stiffness of joints (familial hx of autoimmune disorders), N/T after in prolonged positions, hx of two c-sections (2017 and 2020)   ? PT Treatment/Interventions ADLs/Self Care Home Management;Biofeedback;Moist Heat;Gait training;Stair training;Functional mobility training;Therapeutic activities;Neuromuscular re-education;Balance training;Therapeutic exercise;Patient/family education;Manual techniques;Dry needling;Spinal Manipulations   ? PT Next Visit Plan Review HEP. Toe crunches to HEP. Manual therapy hips, QL, add. prn.  Strengthen core, hips   ? PT Home Exercise Plan [phone]   ? Consulted and Agree with Plan of Care Patient   ? ?  ?  ? ?  ? ? ?Patient will benefit from skilled therapeutic intervention in order to improve the following deficits and impairments:  Abnormal gait, Decreased balance, Decreased endurance, Hypomobility, Pain, Postural dysfunction, Impaired flexibility, Decreased  strength, Decreased coordination, Decreased range of motion ? ?Visit Diagnosis: ?Muscle weakness (generalized) ? ?Other abnormalities of gait and mobility ? ?Other lack of coordination ? ?Urge incontinence ? ? ? ? ?Problem List

## 2021-08-19 DIAGNOSIS — F411 Generalized anxiety disorder: Secondary | ICD-10-CM | POA: Diagnosis not present

## 2021-08-31 ENCOUNTER — Ambulatory Visit: Payer: Federal, State, Local not specified - PPO

## 2021-09-08 ENCOUNTER — Ambulatory Visit: Payer: Federal, State, Local not specified - PPO

## 2021-09-27 ENCOUNTER — Other Ambulatory Visit: Payer: Self-pay

## 2021-09-27 ENCOUNTER — Ambulatory Visit: Payer: Federal, State, Local not specified - PPO | Attending: Physician Assistant

## 2021-09-27 DIAGNOSIS — R2689 Other abnormalities of gait and mobility: Secondary | ICD-10-CM | POA: Diagnosis not present

## 2021-09-27 DIAGNOSIS — N3941 Urge incontinence: Secondary | ICD-10-CM

## 2021-09-27 DIAGNOSIS — M6281 Muscle weakness (generalized): Secondary | ICD-10-CM | POA: Diagnosis not present

## 2021-09-27 DIAGNOSIS — R278 Other lack of coordination: Secondary | ICD-10-CM

## 2021-09-27 NOTE — Patient Instructions (Addendum)
Access Code: [email or web address] URL: [email or web address] Date: 09/27/2021 Prepared by: Zerita Boers  Exercises - Standing Pelvic Tilt  - 1 x daily - 7 x weekly - 1 sets - 5 reps - Supine Diaphragmatic Breathing  - 1 x daily - 7 x weekly - 1 sets - 5 reps - Supine Figure 4 Piriformis Stretch  - 2-3 x daily - 7 x weekly - 1 sets - 3 reps - 30 hold - Supine Piriformis Stretch with Leg Straight  - 1 x daily - 7 x weekly - 3 sets - 10 reps - Clamshell  - 1 x daily - 3 x weekly - 3 sets - 10 reps - Supine Piriformis Stretch  - 2-3 x daily - 7 x weekly - 1 sets - 3 reps - 30-45 hold - Supine Pelvic Floor Contraction  - 1-2 x daily - 7 x weekly - 1 sets - 10 reps  Patient Education - Scar Massage

## 2021-09-27 NOTE — Therapy (Signed)
[place] [facility] [address] [place], [place], [address] Phone: [phone]   Fax:  [phone]  Physical Therapy Treatment  Patient Details  Name: Nicole Mcgrath MRN: [id number] Date of Birth: [birth year] Referring Provider (PT): Dr. Carrie Mew   Encounter Date: 09/27/2021   PT End of Session - 09/27/21 1126     Visit Number 3    Number of Visits 9    Date for PT Re-Evaluation 10/27/21   original POC: 10/01/21 but extended 2/2 pt missing weeks 2/2 illness and vacation   Authorization Type [place] 50 visits PT/OT/Speech    Progress Note Due on Visit 10    PT Start Time 1002    PT Stop Time 1058    PT Time Calculation (min) 56 min    Activity Tolerance Patient tolerated treatment well    Behavior During Therapy [facility] for tasks assessed/performed             Past Medical History:  Diagnosis Date   Gastroesophageal reflux disease 04/27/2017   History of atypical skin mole 04/27/2017   Kidney stones 04/27/2017   Myalgia 04/27/2017   Paresthesia 04/27/2017   PONV (postoperative nausea and vomiting)    Stiffness of joint 04/27/2017   Vaginal Pap smear, abnormal     Past Surgical History:  Procedure Laterality Date   CESAREAN SECTION  2017   CESAREAN SECTION N/A 08/07/2018   Procedure: Repeat CESAREAN SECTION;  Surgeon: Aloha Gell, MD;  Location: [place] LD ORS;  Service: Obstetrics;  Laterality: N/A;  EDD: 08/14/18   LEEP      There were no vitals filed for this visit.   Subjective Assessment - 09/27/21 1006     Subjective Pt reported she has lost about 10 pounds since last visit as she's on a plan to lose weight and work out more. Pt hasn't performed HEP in about 2 weeks 2/2 vacation. Pt had urge incontinence about 3 weeks ago prior to shower. Pt has noticed a change in bowel movements, used to be daily and is now every 2-3 days.    Patient Stated Goals "To improve core strength and incontinence." "Learn how to  improve walking."    Currently in Pain? No/denies                NMR: Pt assessed PFM contraction in sidelying with cue for breath and coordination. Contraction decr. After approx. 4 sec. PT assessed posture-L ASIS elevated in stance. SLS no sway noted.  Access Code: [email or web address] URL: [email or web address] Date: 09/27/2021 Prepared by: Geoffry Paradise  Exercises - Standing Pelvic Tilt  - 1 x daily - 7 x weekly - 1 sets - 5 reps in hooklying. - Supine Diaphragmatic Breathing  - 1 x daily - 7 x weekly - 1 sets - 5 reps - Supine Figure 4 Piriformis Stretch  - 2-3 x daily - 7 x weekly - 1 sets - 3 reps - 30 hold - Supine Piriformis Stretch with Leg Straight  - 1 x daily - 7 x weekly - 3 sets - 10 reps - Clamshell  - 1 x daily - 3 x weekly - 3 sets - 10 reps with and without yellow band - Supine Piriformis Stretch  - 2-3 x daily - 7 x weekly - 1 sets - 3 reps - 30-45 hold - Supine Pelvic Floor Contraction  - 1-2 x daily - 7 x weekly - 1 sets - 10 reps and 5 reps of 4-5 sec.  Holds. Cues for proper technique. S for safety.  Patient Education - Scar Massage (improved mobility of scar and pt reported incr. Sensation). R side tension incr. Vs. L side.  FOTO: 60 on PFDI urinary issues with score closer to 100 indicating better function.                      PT Education - 09/27/21 1125     Education Details PT reviewed and progressed HEP as tolerated. PT reviewed goal progress and FOTO outcome measure score results. PT discussed continuing POC.    Person(s) Educated Patient    Methods Explanation;Tactile cues;Verbal cues;Handout    Comprehension Returned demonstration;Need further instruction              PT Short Term Goals - 09/27/21 1008       PT SHORT TERM GOAL #1   Title Pt will be IND in HEP to improve strength, flexibility and balance.    Time 4    Period Weeks    Status Partially Met    Target Date 08/30/21      PT SHORT TERM GOAL  #2   Title Pt will demonstrate decr. ant. pelvic tilt, equal weight bearing in LEs in seated and standing posture to improve coordination of core, PFM, and hip musculature.    Baseline 6/12: L ASIS is slightly elevated (~0.25")    Time 4    Period Weeks    Status Partially Met    Target Date 08/30/21      PT SHORT TERM GOAL #3   Title Have pt perform FOTO and write goal prn.    Time 4    Period Weeks    Status Achieved    Target Date 08/30/21      PT SHORT TERM GOAL #4   Title Pt will be able to improve PFM coordination to contract and relax PFM in order to reduce urge incontinence.    Baseline Leaks a few times a week.    Time 4    Period Weeks    Status Achieved    Target Date 08/30/21      PT SHORT TERM GOAL #5   Title Formally assess gait and write goal prn.    Time 4    Period Weeks    Status Achieved    Target Date 08/30/21               PT Long Term Goals - 09/27/21 1130       PT LONG TERM GOAL #1   Title Pt will demonstrate proper posture during strength training activities at gym to improve intra-abdominal pressure during activities.    Time 8    Period Weeks    Status New    Target Date 10/27/21      PT LONG TERM GOAL #2   Title Pt will demonstrate improved diastasis recti to </=1 fingers width during assessment to improve intra-abdominal pressure, strength, and coordination.    Baseline 1.5-3 fingers width superior to umbilicus (2" sup. to umbilicus, progressing to 2.5-3 fingers width over umbilicus)    Time 8    Period Weeks    Status New    Target Date 10/27/21      PT LONG TERM GOAL #3   Title Perform internal muscle assessment and write goals prn.    Time 8    Period Weeks    Status New    Target Date 10/27/21  PT LONG TERM GOAL #4   Title Pt will amb. 100' IND with improve hip ER (toe out), decr. B foot pronation, and posture to decr. leakage and pain.    Time 8    Period Weeks    Status New    Target Date 10/27/21      PT LONG  TERM GOAL #5   Title Pt's PFDI (FOTO) urinary score will incr. to >/=67 to improve QOL.    Baseline 60    Time 4    Period Weeks    Status New    Target Date 10/27/21                   Plan - 09/27/21 1127     Clinical Impression Statement Pt demonstrated progress as she partially met STGs 1 and 2, and met STGs 3-5. Pt's glute med strength improved as she was able to progress to yellow band resistance vs. no resistance and SLS balance improved (B). Pt's L ASIS is elevated and cues to improve posture during sitting (not cross LEs). Pt's FOTO score (PFDI) indicates urinary issues have a moderate impact on QOL. Pt continues to experience urge incontinence and would continue to benefit from skilled PT to improve all impairments listed above to improve function during all ADLs. Pt missed over a month of PT 2/2 illness and vacation, therefore, all LTGs pushed back a month.    Comorbidities hx of kidney stones and UTIs, anxiety, GERD, stiffness of joints (familial hx of autoimmune disorders), N/T after in prolonged positions, hx of two c-sections (2017 and 2020)    PT Treatment/Interventions ADLs/Self Care Home Management;Biofeedback;Moist Heat;Gait training;Stair training;Functional mobility training;Therapeutic activities;Neuromuscular re-education;Balance training;Therapeutic exercise;Patient/family education;Manual techniques;Dry needling;Spinal Manipulations    PT Next Visit Plan Toe crunches to HEP. Manual therapy hips, QL, add. prn.  Strengthen core, hips    PT Home Exercise Plan [email or web address]    Consulted and Agree with Plan of Care Patient             Patient will benefit from skilled therapeutic intervention in order to improve the following deficits and impairments:  Abnormal gait, Decreased balance, Decreased endurance, Hypomobility, Pain, Postural dysfunction, Impaired flexibility, Decreased strength, Decreased coordination, Decreased range of motion  Visit Diagnosis: Muscle  weakness (generalized) - Plan: PT plan of care cert/re-cert  Other abnormalities of gait and mobility - Plan: PT plan of care cert/re-cert  Other lack of coordination - Plan: PT plan of care cert/re-cert  Urge incontinence - Plan: PT plan of care cert/re-cert     Problem List Patient Active Problem List   Diagnosis Date Noted   Status post repeat low transverse cesarean section 08/07/2018   Gastroesophageal reflux disease 04/27/2017   History of atypical skin mole 04/27/2017   Kidney stones 04/27/2017   Myalgia 04/27/2017   Paresthesia 04/27/2017   Stiffness of joint 04/27/2017    Blaike Vickers L, PT 09/27/2021, 11:33 AM  [place] MAIN [facility] SERVICES [address] [place], [place], [address] Phone: [phone]   Fax:  [phone]  Name: Nicole Mcgrath MRN: [id number] Date of Birth: Sep 03, 1985   Geoffry Paradise, PT,DPT 09/27/21 11:36 AM Phone: [phone] Fax: [phone]

## 2021-10-06 ENCOUNTER — Other Ambulatory Visit: Payer: Self-pay

## 2021-10-06 ENCOUNTER — Ambulatory Visit: Payer: Federal, State, Local not specified - PPO

## 2021-10-06 DIAGNOSIS — R2689 Other abnormalities of gait and mobility: Secondary | ICD-10-CM | POA: Diagnosis not present

## 2021-10-06 DIAGNOSIS — M6281 Muscle weakness (generalized): Secondary | ICD-10-CM | POA: Diagnosis not present

## 2021-10-06 DIAGNOSIS — N3941 Urge incontinence: Secondary | ICD-10-CM

## 2021-10-06 DIAGNOSIS — R278 Other lack of coordination: Secondary | ICD-10-CM | POA: Diagnosis not present

## 2021-10-06 NOTE — Therapy (Signed)
OUTPATIENT PHYSICAL THERAPY TREATMENT NOTE   Patient Name: Nicole Mcgrath MRN: [id number] DOB:06/04/1985, 36 y.o., female Today's Date: 10/06/2021  PCP: Paulita Cradle, MD    PT End of Session - 10/06/21 1005     Visit Number 4    Number of Visits 9    Date for PT Re-Evaluation 10/27/21    Authorization Type [employer] Federal 50 visits PT/OT/Speech    Progress Note Due on Visit 10    PT Start Time 1003    PT Stop Time 1059    PT Time Calculation (min) 56 min    Activity Tolerance Patient tolerated treatment well    Behavior During Therapy [facility] for tasks assessed/performed             Past Medical History:  Diagnosis Date   Gastroesophageal reflux disease 04/27/2017   History of atypical skin mole 04/27/2017   Kidney stones 04/27/2017   Myalgia 04/27/2017   Paresthesia 04/27/2017   PONV (postoperative nausea and vomiting)    Stiffness of joint 04/27/2017   Vaginal Pap smear, abnormal    Past Surgical History:  Procedure Laterality Date   CESAREAN SECTION  2017   CESAREAN SECTION N/A 08/07/2018   Procedure: Repeat CESAREAN SECTION;  Surgeon: Aloha Gell, MD;  Location: [place] LD ORS;  Service: Obstetrics;  Laterality: N/A;  EDD: 08/14/18   LEEP     Patient Active Problem List   Diagnosis Date Noted   Status post repeat low transverse cesarean section 08/07/2018   Gastroesophageal reflux disease 04/27/2017   History of atypical skin mole 04/27/2017   Kidney stones 04/27/2017   Myalgia 04/27/2017   Paresthesia 04/27/2017   Stiffness of joint 04/27/2017    REFERRING DIAG: diastatsis recti, separation of muscle  THERAPY DIAG:  Muscle weakness (generalized)  Other abnormalities of gait and mobility  Other lack of coordination  Urge incontinence  Rationale for Evaluation and Treatment Rehabilitation  PERTINENT HISTORY: hx of kidney stones and UTIs, anxiety, GERD, stiffness of joints (familial hx of autoimmune disorders), N/T after in prolonged positions, hx of  two c-sections (2017 and 2020)   PRECAUTIONS: none  SUBJECTIVE: Pt reported she feels like she hasn't done a lot of the diaphragmatic breathing  or PFM contractions often enough-on average about twice a week. Pt reported leakage has been better, she was able hold urine and not leak during urgency episode.   PAIN:  Are you having pain? No     TODAY'S TREATMENT:  NMR: Access Code: [email or web address] URL: [email or web address] Date: 10/06/2021  Exercises - Seated Great Toe Extension  - 1 x daily - 3 x weekly - 3 sets - 10 reps - Seated Lesser Toes Extension  - 1 x daily - 3 x weekly - 3 sets - 10 reps - Towel Scrunches  - 1 x daily - 3 x weekly - 3 sets - 10 reps - Toe Spreading  - 1 x daily - 3 x weekly - 3 sets - 10 reps - Standing Ankle Dorsiflexion Stretch  - 1 x daily - 7 x weekly - 1 sets - 3 reps - 30-45 hold Performed with S for safety. Cues and demo for new HEP to decr. Foot/LE tension which can impact pelvic floor.   Manual therapy: PT assessed spine mobility-WNL. PT assessed sacrum/coccyx mobility-L sacrum in post. Rotation and decr. Rotation during R SLS. Performed rolled washcloth to L sacrum for 5 minutes to improve mobility. Added to HEP. Pt with L iliac crest 0.25" elevated in stance.  PT performed STM to L add/quad with pt in supine.    PATIENT EDUCATION: Education details: PT discussed goal progress. Pt stated she feels 50-60% improved on GROC. Added new activities to HEP. PT also educated pt on using [employer] app for exercise. Person educated: Patient Education method: Explanation, Demonstration, Verbal cues, and Handouts Education comprehension: verbalized understanding, returned demonstration, and needs further education   HOME EXERCISE PROGRAM: See above.   PT Short Term Goals - 09/27/21 1008       PT SHORT TERM GOAL #1   Title Pt will be IND in HEP to improve strength, flexibility and balance.    Time 4    Period Weeks    Status Partially Met     Target Date 08/30/21      PT SHORT TERM GOAL #2   Title Pt will demonstrate decr. ant. pelvic tilt, equal weight bearing in LEs in seated and standing posture to improve coordination of core, PFM, and hip musculature.    Baseline 6/12: L ASIS is slightly elevated (~0.25")    Time 4    Period Weeks    Status Partially Met    Target Date 08/30/21      PT SHORT TERM GOAL #3   Title Have pt perform FOTO and write goal prn.    Time 4    Period Weeks    Status Achieved    Target Date 08/30/21      PT SHORT TERM GOAL #4   Title Pt will be able to improve PFM coordination to contract and relax PFM in order to reduce urge incontinence.    Baseline Leaks a few times a week.    Time 4    Period Weeks    Status Achieved    Target Date 08/30/21      PT SHORT TERM GOAL #5   Title Formally assess gait and write goal prn.    Time 4    Period Weeks    Status Achieved    Target Date 08/30/21              PT Long Term Goals - 09/27/21 1130       PT LONG TERM GOAL #1   Title Pt will demonstrate proper posture during strength training activities at gym to improve intra-abdominal pressure during activities.    Time 8    Period Weeks    Status New    Target Date 10/27/21      PT LONG TERM GOAL #2   Title Pt will demonstrate improved diastasis recti to </=1 fingers width during assessment to improve intra-abdominal pressure, strength, and coordination.    Baseline 1.5-3 fingers width superior to umbilicus (2" sup. to umbilicus, progressing to 2.5-3 fingers width over umbilicus)    Time 8    Period Weeks    Status New    Target Date 10/27/21      PT LONG TERM GOAL #3   Title Perform internal muscle assessment and write goals prn.    Time 8    Period Weeks    Status New    Target Date 10/27/21      PT LONG TERM GOAL #4   Title Pt will amb. 100' IND with improve hip ER (toe out), decr. B foot pronation, and posture to decr. leakage and pain.    Time 8    Period Weeks     Status New    Target Date 10/27/21      PT LONG  TERM GOAL #5   Title Pt's PFDI (FOTO) urinary score will incr. to >/=67 to improve QOL.    Baseline 60    Time 4    Period Weeks    Status New    Target Date 10/27/21            Clinical Impression statement: Pt demonstrated progress as she tolerated HEP progression (feet) to decr. LE/PFM tension. Pt's L ASIS is elevated by 0.25" in stance. Pt's spine mobility WNL. Pt continues to experience core, hip, and LE weakness. Pt continues to experience urge incontinence, though she was able to stop leakage yesterday, and would continue to benefit from skilled PT to improve all impairments listed above to improve function during all ADLs.   Plan: deep core, potential internal assessment, progress PFM contraction prn.     Eustace Hur L, PT 10/06/2021, 12:58 PM    Geoffry Paradise, PT,DPT 10/06/21 12:58 PM Phone: [phone] Fax: [phone]

## 2021-10-06 NOTE — Patient Instructions (Signed)
Sacrum: Place a rolled washcloth along LEFT sacrum and lie down for 5 minutes daily for first week and then twice a week during second week.

## 2021-10-11 ENCOUNTER — Other Ambulatory Visit: Payer: Self-pay

## 2021-10-11 ENCOUNTER — Ambulatory Visit: Payer: Federal, State, Local not specified - PPO

## 2021-10-11 DIAGNOSIS — R278 Other lack of coordination: Secondary | ICD-10-CM | POA: Diagnosis not present

## 2021-10-11 DIAGNOSIS — M6281 Muscle weakness (generalized): Secondary | ICD-10-CM | POA: Diagnosis not present

## 2021-10-11 DIAGNOSIS — N3941 Urge incontinence: Secondary | ICD-10-CM

## 2021-10-11 DIAGNOSIS — R2689 Other abnormalities of gait and mobility: Secondary | ICD-10-CM | POA: Diagnosis not present

## 2021-10-26 ENCOUNTER — Other Ambulatory Visit: Payer: Self-pay

## 2021-10-26 ENCOUNTER — Ambulatory Visit: Payer: Federal, State, Local not specified - PPO | Attending: Physician Assistant

## 2021-10-26 DIAGNOSIS — R2689 Other abnormalities of gait and mobility: Secondary | ICD-10-CM | POA: Insufficient documentation

## 2021-10-26 DIAGNOSIS — M6281 Muscle weakness (generalized): Secondary | ICD-10-CM | POA: Diagnosis not present

## 2021-10-26 DIAGNOSIS — N3941 Urge incontinence: Secondary | ICD-10-CM | POA: Insufficient documentation

## 2021-10-26 DIAGNOSIS — R278 Other lack of coordination: Secondary | ICD-10-CM | POA: Insufficient documentation

## 2021-10-26 NOTE — Therapy (Signed)
OUTPATIENT PHYSICAL THERAPY TREATMENT NOTE   Patient Name: Nicole Mcgrath MRN: [id number] DOB:Feb 17, 1986, 36 y.o., female Today's Date: 10/26/2021  PCP: Paulita Cradle, MD    PT End of Session - 10/26/21 0934     Visit Number 6    Number of Visits 9    Date for PT Re-Evaluation 10/27/21    Authorization Type [place] 50 visits PT/OT/Speech    Progress Note Due on Visit 10    PT Start Time 0930    PT Stop Time 1015    PT Time Calculation (min) 45 min    Activity Tolerance Patient tolerated treatment well    Behavior During Therapy [facility] for tasks assessed/performed              Past Medical History:  Diagnosis Date   Gastroesophageal reflux disease 04/27/2017   History of atypical skin mole 04/27/2017   Kidney stones 04/27/2017   Myalgia 04/27/2017   Paresthesia 04/27/2017   PONV (postoperative nausea and vomiting)    Stiffness of joint 04/27/2017   Vaginal Pap smear, abnormal    Past Surgical History:  Procedure Laterality Date   CESAREAN SECTION  2017   CESAREAN SECTION N/A 08/07/2018   Procedure: Repeat CESAREAN SECTION;  Surgeon: Aloha Gell, MD;  Location: [place] LD ORS;  Service: Obstetrics;  Laterality: N/A;  EDD: 08/14/18   LEEP     Patient Active Problem List   Diagnosis Date Noted   Status post repeat low transverse cesarean section 08/07/2018   Gastroesophageal reflux disease 04/27/2017   History of atypical skin mole 04/27/2017   Kidney stones 04/27/2017   Myalgia 04/27/2017   Paresthesia 04/27/2017   Stiffness of joint 04/27/2017    REFERRING DIAG: diastatsis recti, separation of muscle  THERAPY DIAG:  Muscle weakness (generalized)  Other abnormalities of gait and mobility  Other lack of coordination  Urge incontinence  Rationale for Evaluation and Treatment Rehabilitation  PERTINENT HISTORY: hx of kidney stones and UTIs, anxiety, GERD, stiffness of joints (familial hx of autoimmune disorders), N/T after in prolonged positions, hx  of two c-sections (2017 and 2020)   PRECAUTIONS: none  SUBJECTIVE: SUBJECTIVE: Pt reported she's been feeling really good. She hasn't had any leakage since last visit. She feels stronger in her abdominals and feels like diastasis recti is better. She has substituted dead bugs for crunches. She feels like she needs to work on hip strengthening. Pt reported dizziness is so much better. Pt is back on E2M.   PAIN:  Are you having pain? No     TODAY'S TREATMENT:   NMR:  ~Assessed diastasis recti: 1-1.5 fingers width sup. And inf. To umbilicus (1.5 fingers width at umbilicus).  ~Access Code: [email or web address] URL: [email or web address] Date: 10/11/2021 Prepared by: Geoffry Paradise  Exercises - Dead Bug  - 1 x daily - 3 x weekly - 5 sets - 2 reps (LEs and then UEs separately and then together) with extensive cues to engage TrA muscluature.  - Side Plank on Knees  - 1 x daily - 3 x weekly - 1 sets - 3 reps - 5 hold -Side plank: 30 sec.  - Plank on Knees  - 1 x daily - 3 x weekly - 1 sets - 3 reps - 15 hold -Plank on forearms: 15 sec.  -Clamshells B 3x10 reps.  -Supine piriformis stretch 3x30sec.  -Pt amb. 4x75' after HEP with cues to improve arm swing, less foot IR noted. Pt still pronates during B midstance.   Cues and  demo for technique and S for safety.   PATIENT EDUCATION: Education details: PT educated on how to [place]. Lightheadedness upon changing positions.Educated pt on how to modify abdominal workout at gym vs. Crunches/sit-ups as this can incr. Intra-abdominal pressure and incr. PFM issues.  PAIN:  Are you having pain? No     HOME EXERCISE PROGRAM: Medbridge: [email or web address]   PT Short Term Goals - 10/11/21 1008       PT SHORT TERM GOAL #1   Title Pt will be IND in HEP to improve strength, flexibility and balance.    Time 4    Period Weeks    Status Partially Met    Target Date 08/30/21      PT SHORT TERM GOAL #2   Title Pt will demonstrate decr. ant. pelvic tilt,  equal weight bearing in LEs in seated and standing posture to improve coordination of core, PFM, and hip musculature.    Baseline 6/12: L ASIS is slightly elevated (~0.25")    Time 4    Period Weeks    Status Partially Met    Target Date 08/30/21      PT SHORT TERM GOAL #3   Title Have pt perform FOTO and write goal prn.    Time 4    Period Weeks    Status Achieved    Target Date 08/30/21      PT SHORT TERM GOAL #4   Title Pt will be able to improve PFM coordination to contract and relax PFM in order to reduce urge incontinence.    Baseline Leaks a few times a week.    Time 4    Period Weeks    Status Achieved    Target Date 08/30/21      PT SHORT TERM GOAL #5   Title Formally assess gait and write goal prn.    Time 4    Period Weeks    Status Achieved    Target Date 08/30/21              PT Long Term Goals - 10/11/21 1130       PT LONG TERM GOAL #1   Title Pt will demonstrate proper posture during strength training activities at gym to improve intra-abdominal pressure during activities.    Time 8    Period Weeks    Status New    Target Date 10/27/21      PT LONG TERM GOAL #2   Title Pt will demonstrate improved diastasis recti to </=1 fingers width during assessment to improve intra-abdominal pressure, strength, and coordination.    Baseline 1.5-3 fingers width superior to umbilicus (2" sup. to umbilicus, progressing to 2.5-3 fingers width over umbilicus)    Time 8    Period Weeks    Status New    Target Date 10/27/21      PT LONG TERM GOAL #3   Title Perform internal muscle assessment and write goals prn.    Time 8    Period Weeks    Status New    Target Date 10/27/21      PT LONG TERM GOAL #4   Title Pt will amb. 100' IND with improve hip ER (toe out), decr. B foot pronation, and posture to decr. leakage and pain.    Time 8    Period Weeks    Status New    Target Date 10/27/21      PT LONG TERM GOAL #5   Title Pt's PFDI (FOTO) urinary score will  incr. to >/=67 to improve QOL.    Baseline 60    Time 4    Period Weeks    Status New    Target Date 10/27/21            Clinical Impression statement: Pt demonstrated progress as diastasis recti decr. From 3-3.5 fingers width to 1-1.5 fingers width and reported no leakage. Pt also able to tolerate progression of core HEP progression without coning of abdominals 2/2 diastasis recti. Pt continues to experience postural dysfunction, and decr. Strength. LTGs will be carried over to next session 2/2 pt missing last week 2/2 PT's being absent. Pt would continue to benefit from skilled PT to improve all impairments listed above to improve function during all ADLs.   Plan: assess LTGs, deep core progression, potential internal assessment, progress PFM contraction prn.     Alven Alverio L, PT 10/26/2021, 10:32 AM    Geoffry Paradise, PT,DPT 10/26/21 10:32 AM Phone: [phone] Fax: [phone]

## 2021-11-11 DIAGNOSIS — F411 Generalized anxiety disorder: Secondary | ICD-10-CM | POA: Diagnosis not present

## 2021-11-17 ENCOUNTER — Other Ambulatory Visit: Payer: Self-pay

## 2021-11-17 ENCOUNTER — Ambulatory Visit: Payer: Federal, State, Local not specified - PPO | Attending: Physician Assistant

## 2021-11-17 DIAGNOSIS — R2689 Other abnormalities of gait and mobility: Secondary | ICD-10-CM | POA: Insufficient documentation

## 2021-11-17 DIAGNOSIS — N3941 Urge incontinence: Secondary | ICD-10-CM | POA: Insufficient documentation

## 2021-11-17 DIAGNOSIS — R278 Other lack of coordination: Secondary | ICD-10-CM | POA: Insufficient documentation

## 2021-11-17 DIAGNOSIS — M6281 Muscle weakness (generalized): Secondary | ICD-10-CM | POA: Insufficient documentation

## 2021-11-17 NOTE — Therapy (Addendum)
OUTPATIENT PHYSICAL THERAPY TREATMENT NOTE/recert   Patient Name: Nicole Mcgrath MRN: [id number] DOB:[birth year], 36 y.o., female Today's Date: 11/17/2021  PCP: Paulita Cradle, MD    PT End of Session - 11/17/21 0935     Visit Number 7    Number of Visits 9    Date for PT Re-Evaluation 10/27/21    Authorization Type [place] 50 visits PT/OT/Speech    Progress Note Due on Visit 10    PT Start Time 0932    PT Stop Time 1000   hold and likely d/c   PT Time Calculation (min) 28 min    Activity Tolerance Patient tolerated treatment well    Behavior During Therapy [facility] for tasks assessed/performed              Past Medical History:  Diagnosis Date   Gastroesophageal reflux disease 04/27/2017   History of atypical skin mole 04/27/2017   Kidney stones 04/27/2017   Myalgia 04/27/2017   Paresthesia 04/27/2017   PONV (postoperative nausea and vomiting)    Stiffness of joint 04/27/2017   Vaginal Pap smear, abnormal    Past Surgical History:  Procedure Laterality Date   CESAREAN SECTION  2017   CESAREAN SECTION N/A 08/07/2018   Procedure: Repeat CESAREAN SECTION;  Surgeon: Aloha Gell, MD;  Location: [place] LD ORS;  Service: Obstetrics;  Laterality: N/A;  EDD: 08/14/18   LEEP     Patient Active Problem List   Diagnosis Date Noted   Status post repeat low transverse cesarean section 08/07/2018   Gastroesophageal reflux disease 04/27/2017   History of atypical skin mole 04/27/2017   Kidney stones 04/27/2017   Myalgia 04/27/2017   Paresthesia 04/27/2017   Stiffness of joint 04/27/2017    REFERRING DIAG: diastatsis recti, separation of muscle  THERAPY DIAG:  Muscle weakness (generalized) - Plan: PT plan of care cert/re-cert  Other abnormalities of gait and mobility - Plan: PT plan of care cert/re-cert  Other lack of coordination - Plan: PT plan of care cert/re-cert  Urge incontinence - Plan: PT plan of care cert/re-cert  Rationale for Evaluation and Treatment  Rehabilitation  PERTINENT HISTORY: hx of kidney stones and UTIs, anxiety, GERD, stiffness of joints (familial hx of autoimmune disorders), N/T after in prolonged positions, hx of two c-sections (2017 and 2020)   PRECAUTIONS: none  SUBJECTIVE: SUBJECTIVE: Pt reported she hasn't had time for HEP 2/2 grandmother passing away and hosting family. Pt denied leakage since last visit. Pt is pausing E2M.   PAIN:  Are you having pain? No     TODAY'S TREATMENT:   NMR:  ~Assessed diastasis recti: 1 fingers width sup. And inf. To umbilicus and at umbilicus.  Access Code: [email or web address] URL: [email or web address] Date: 11/17/2021 Prepared by: Geoffry Paradise  Exercises -supine and seated pelvic floor contraction B22 reps of quick flicks and x5 reps of 10 sec. Holds. -reviewed all HEP and demonstrated proper posture during lifting and how to engage core/breathing coordination to reduce leakage.   Cues and demo for technique and S for safety.    FOTO: 67 urinary issues filled out 1000-1005 (no charge)  PATIENT EDUCATION: Education details: PT educated on LTG progress, outcome measure results. PT educated pt on progression of HEP and that PT will hold for one month per pt request vs. D/c to ensure leakage has ceased. PT educated pt on proper posture and breathing when lifting weights at gym. Pt educated with verbal cues and pt demonstrated and verbalized understanding.  HOME EXERCISE PROGRAM: Medbridge: [email or web address]   PT Short Term Goals - 10/11/21 1008       PT SHORT TERM GOAL #1   Title Pt will be IND in HEP to improve strength, flexibility and balance.    Time 4    Period Weeks    Status Partially Met    Target Date 08/30/21      PT SHORT TERM GOAL #2   Title Pt will demonstrate decr. ant. pelvic tilt, equal weight bearing in LEs in seated and standing posture to improve coordination of core, PFM, and hip musculature.    Baseline 6/12: L ASIS is slightly elevated  (~0.25")    Time 4    Period Weeks    Status Partially Met    Target Date 08/30/21      PT SHORT TERM GOAL #3   Title Have pt perform FOTO and write goal prn.    Time 4    Period Weeks    Status Achieved    Target Date 08/30/21      PT SHORT TERM GOAL #4   Title Pt will be able to improve PFM coordination to contract and relax PFM in order to reduce urge incontinence.    Baseline Leaks a few times a week.    Time 4    Period Weeks    Status Achieved    Target Date 08/30/21      PT SHORT TERM GOAL #5   Title Formally assess gait and write goal prn.    Time 4    Period Weeks    Status Achieved    Target Date 08/30/21             PT Long Term Goals - 11/17/21 0939       PT LONG TERM GOAL #1   Title Pt will demonstrate proper posture during strength training activities at gym to improve intra-abdominal pressure during activities.    Time 8    Period Weeks    Status Partially Met    Target Date 10/27/21      PT LONG TERM GOAL #2   Title Pt will demonstrate improved diastasis recti to </=1 fingers width during assessment to improve intra-abdominal pressure, strength, and coordination.    Baseline 1.5-3 fingers width superior to umbilicus (2" sup. to umbilicus, progressing to 2.5-3 fingers width over umbilicus). 8/2: less than 1 fingers width all along abdominals    Time 8    Period Weeks    Status Achieved    Target Date 10/27/21      PT LONG TERM GOAL #3   Title Perform internal muscle assessment and write goals prn.    Time 8    Period Weeks    Status Deferred    Target Date 10/27/21      PT LONG TERM GOAL #4   Title Pt will amb. 100' IND with improve hip ER (toe out), decr. B foot pronation, and posture to decr. leakage and pain.    Time 8    Period Weeks    Status Achieved    Target Date 10/27/21      PT LONG TERM GOAL #5   Title Pt's PFDI (FOTO) urinary score will incr. to >/=67 to improve QOL.    Baseline 60. 8/2:    Time 4    Period Weeks     Status Achieved    Target Date 10/27/21  Clinical Impression statement: Pt demonstrated progress as she met all LTGs, except for deferred internal muscle assessment (not needed at this time) and posture during lifting partially met. Pt also required cues to improve upright posture and decr. Round shoulders during seated PFM contraction HEP. Pt's diastasis recti improved to 1 fingers width and reported no leakage. Pt has not experience leakage in over one month. PT will hold pt for one month and likely d/c after that time frame 2/2 pt's progress.   Plan: hold for one month.    Martisha Toulouse L, PT 11/17/2021, 10:16 AM    Geoffry Paradise, PT,DPT 11/17/21 10:16 AM Phone: [phone] Fax: [phone]

## 2021-12-31 DIAGNOSIS — F411 Generalized anxiety disorder: Secondary | ICD-10-CM | POA: Diagnosis not present

## 2022-01-26 DIAGNOSIS — Z6824 Body mass index (BMI) 24.0-24.9, adult: Secondary | ICD-10-CM | POA: Diagnosis not present

## 2022-01-26 DIAGNOSIS — Z01419 Encounter for gynecological examination (general) (routine) without abnormal findings: Secondary | ICD-10-CM | POA: Diagnosis not present

## 2022-02-11 DIAGNOSIS — F411 Generalized anxiety disorder: Secondary | ICD-10-CM | POA: Diagnosis not present

## 2022-05-05 ENCOUNTER — Ambulatory Visit
Admission: EM | Admit: 2022-05-05 | Discharge: 2022-05-05 | Disposition: A | Discharge status: Home / Self Care | Payer: Federal, State, Local not specified - PPO | Attending: Emergency Medicine | Admitting: Emergency Medicine

## 2022-05-05 DIAGNOSIS — R6889 Other general symptoms and signs: Secondary | ICD-10-CM | POA: Diagnosis not present

## 2022-05-05 DIAGNOSIS — U071 COVID-19: Secondary | ICD-10-CM | POA: Insufficient documentation

## 2022-05-05 DIAGNOSIS — B349 Viral infection, unspecified: Secondary | ICD-10-CM | POA: Insufficient documentation

## 2022-05-05 MED ORDER — OSELTAMIVIR PHOSPHATE 75 MG PO CAPS: 75.0000 mg | ORAL_CAPSULE | Freq: Two times a day (BID) | ORAL | 0 refills | Status: AC

## 2022-05-05 NOTE — ED Triage Notes (Signed)
Patient to Urgent Care with complaints of fevers, sneezing, and nasal congestion.  Symptoms started yesterday. Denies n/v/ sore throat.   Reports fever last night of 103.   Has been taking nyquil and ibuprofen.

## 2022-05-05 NOTE — ED Provider Notes (Signed)
Renaldo Fiddler    CSN: [id number] Arrival date & time: 05/05/22  1325      History   Chief Complaint Chief Complaint  Patient presents with   Fever    Flu like symptoms - Entered by patient    HPI Nicole Mcgrath is a 37 y.o. female.  Patient presents with fever, chills, nasal congestion, runny nose, sneezing since yesterday.  Tmax 103 during the night.  Treatment at home with ibuprofen and NyQuil.  She denies sore throat, cough, shortness of breath, vomiting, diarrhea, or other symptoms.  Her medical history includes GERD, kidney stones.  The history is provided by the patient and medical records.    Past Medical History:  Diagnosis Date   Gastroesophageal reflux disease 04/27/2017   History of atypical skin mole 04/27/2017   Kidney stones 04/27/2017   Myalgia 04/27/2017   Paresthesia 04/27/2017   PONV (postoperative nausea and vomiting)    Stiffness of joint 04/27/2017   Vaginal Pap smear, abnormal     Patient Active Problem List   Diagnosis Date Noted   Status post repeat low transverse cesarean section 08/07/2018   Gastroesophageal reflux disease 04/27/2017   History of atypical skin mole 04/27/2017   Kidney stones 04/27/2017   Myalgia 04/27/2017   Paresthesia 04/27/2017   Stiffness of joint 04/27/2017    Past Surgical History:  Procedure Laterality Date   CESAREAN SECTION  2017   CESAREAN SECTION N/A 08/07/2018   Procedure: Repeat CESAREAN SECTION;  Surgeon: Noland Fordyce, MD;  Location: [facility];  Service: Obstetrics;  Laterality: N/A;  EDD: 08/14/18   LEEP     WISDOM TOOTH EXTRACTION      OB History     Gravida  2   Para  2   Term  1   Preterm      AB      Living  1      SAB      IAB      Ectopic      Multiple  0   Live Births  1            Home Medications    Prior to Admission medications   Medication Sig Start Date End Date Taking? Authorizing Provider  oseltamivir (TAMIFLU) 75 MG capsule Take 1 capsule (75 mg  total) by mouth every 12 (twelve) hours. 05/05/22  Yes Mickie Bail, NP  doxycycline (VIBRA-TABS) 100 MG tablet Take 1 tablet (100 mg total) by mouth 2 (two) times daily. Patient not taking: Reported on 08/02/2021 06/27/21   Jarold Motto, PA  escitalopram (LEXAPRO) 10 MG tablet Take by mouth. 12/07/20   [provider]    Family History Family History  Problem Relation Age of Onset   Hashimoto's thyroiditis Mother    Depression Mother    Hypertension Father    Hashimoto's thyroiditis Sister    Depression Sister    Diabetes Maternal Uncle    Breast cancer Paternal Aunt    Heart disease Maternal Grandfather    Heart attack Maternal Grandfather    Heart disease Paternal Grandfather    Heart attack Paternal Grandfather    Prostate cancer Neg Hx    Bladder Cancer Neg Hx    Kidney cancer Neg Hx     Social History Social History   Tobacco Use   Smoking status: Never   Smokeless tobacco: Never  Vaping Use   Vaping Use: Never used  Substance Use Topics   Alcohol use:  Yes    Comment: rarely (1-2 times/month)   Drug use: No     Allergies   Iodine   Review of Systems Review of Systems  Constitutional:  Positive for chills and fever.  HENT:  Positive for congestion, postnasal drip and rhinorrhea. Negative for ear pain and sore throat.   Respiratory:  Negative for cough and shortness of breath.   Cardiovascular:  Negative for chest pain and palpitations.  Gastrointestinal:  Negative for abdominal pain, diarrhea and vomiting.  Skin:  Negative for color change and rash.  All other systems reviewed and are negative.    Physical Exam Triage Vital Signs ED Triage Vitals  Enc Vitals Group     BP      Pulse      Resp      Temp      Temp src      SpO2      Weight      Height      Head Circumference      Peak Flow      Pain Score      Pain Loc      Pain Edu?      Excl. in [place]?    No data found.  Updated Vital Signs BP 102/70   Pulse 77   Temp 98.4 F  (36.9 C) (Oral)   Resp 18   Ht 5\' 2"  (1.575 m)   Wt 130 lb (59 kg)   LMP 04/25/2022   SpO2 95%   BMI 23.78 kg/m   Visual Acuity Right Eye Distance:   Left Eye Distance:   Bilateral Distance:    Right Eye Near:   Left Eye Near:    Bilateral Near:     Physical Exam Vitals and nursing note reviewed.  Constitutional:      General: She is not in acute distress.    Appearance: Normal appearance. She is well-developed. She is not ill-appearing.  HENT:     Right Ear: Tympanic membrane normal.     Left Ear: Tympanic membrane normal.     Nose: Congestion and rhinorrhea present.     Mouth/Throat:     Mouth: Mucous membranes are moist.     Pharynx: Oropharynx is clear.  Cardiovascular:     Rate and Rhythm: Normal rate and regular rhythm.     Heart sounds: Normal heart sounds.  Pulmonary:     Effort: Pulmonary effort is normal. No respiratory distress.     Breath sounds: Normal breath sounds.  Musculoskeletal:     Cervical back: Neck supple.  Skin:    General: Skin is warm and dry.  Neurological:     Mental Status: She is alert.  Psychiatric:        Mood and Affect: Mood normal.        Behavior: Behavior normal.      UC Treatments / Results  Labs (all labs ordered are listed, but only abnormal results are displayed) Labs Reviewed - No data to display  EKG   Radiology No results found.  Procedures Procedures (including critical care time)  Medications Ordered in UC Medications - No data to display  Initial Impression / Assessment and Plan / UC Course  I have reviewed the triage vital signs and the nursing notes.  Pertinent labs & imaging results that were available during my care of the patient were reviewed by me and considered in my medical decision making (see chart for details).    Flu-like symptoms, viral  illness.  COVID pending.  Treating with Tamiflu; instructed patient to stop this mediation if COVID test is positive.  Discussed with patient that she  does not meet criteria for COVID treatment.  Discussed symptomatic treatment including Tylenol or ibuprofen, rest, hydration.  Instructed patient to follow up with her PCP if symptoms are not improving.  She agrees to plan of care.   Final Clinical Impressions(s) / UC Diagnoses   Final diagnoses:  Flu-like symptoms  Viral illness     Discharge Instructions      Take the Tamiflu as directed.    Your COVID test is pending.  If it is positive, stop the Tamiflu.   Take Tylenol or ibuprofen as needed for fever or discomfort.  Rest and keep yourself hydrated.    Follow-up with your primary care provider if your symptoms are not improving.         ED Prescriptions     Medication Sig Dispense Auth. Provider   oseltamivir (TAMIFLU) 75 MG capsule Take 1 capsule (75 mg total) by mouth every 12 (twelve) hours. 10 capsule Sharion Balloon, NP      PDMP not reviewed this encounter.   Sharion Balloon, NP 05/05/22 [phone]

## 2022-05-05 NOTE — Discharge Instructions (Addendum)
Take the Tamiflu as directed.    Your COVID test is pending.  If it is positive, stop the Tamiflu.   Take Tylenol or ibuprofen as needed for fever or discomfort.  Rest and keep yourself hydrated.    Follow-up with your primary care provider if your symptoms are not improving.

## 2022-05-06 LAB — SARS CORONAVIRUS 2 (TAT 6-24 HRS): SARS Coronavirus 2: POSITIVE — AB

## 2022-10-23 IMAGING — DX DG CHEST 2V
2 series · 2 of 2 positions shown · non-contrast
Comparison: None.

CLINICAL DATA: Cough and fever with shortness of breath

EXAM:
CHEST - 2 VIEW

[chest pa]
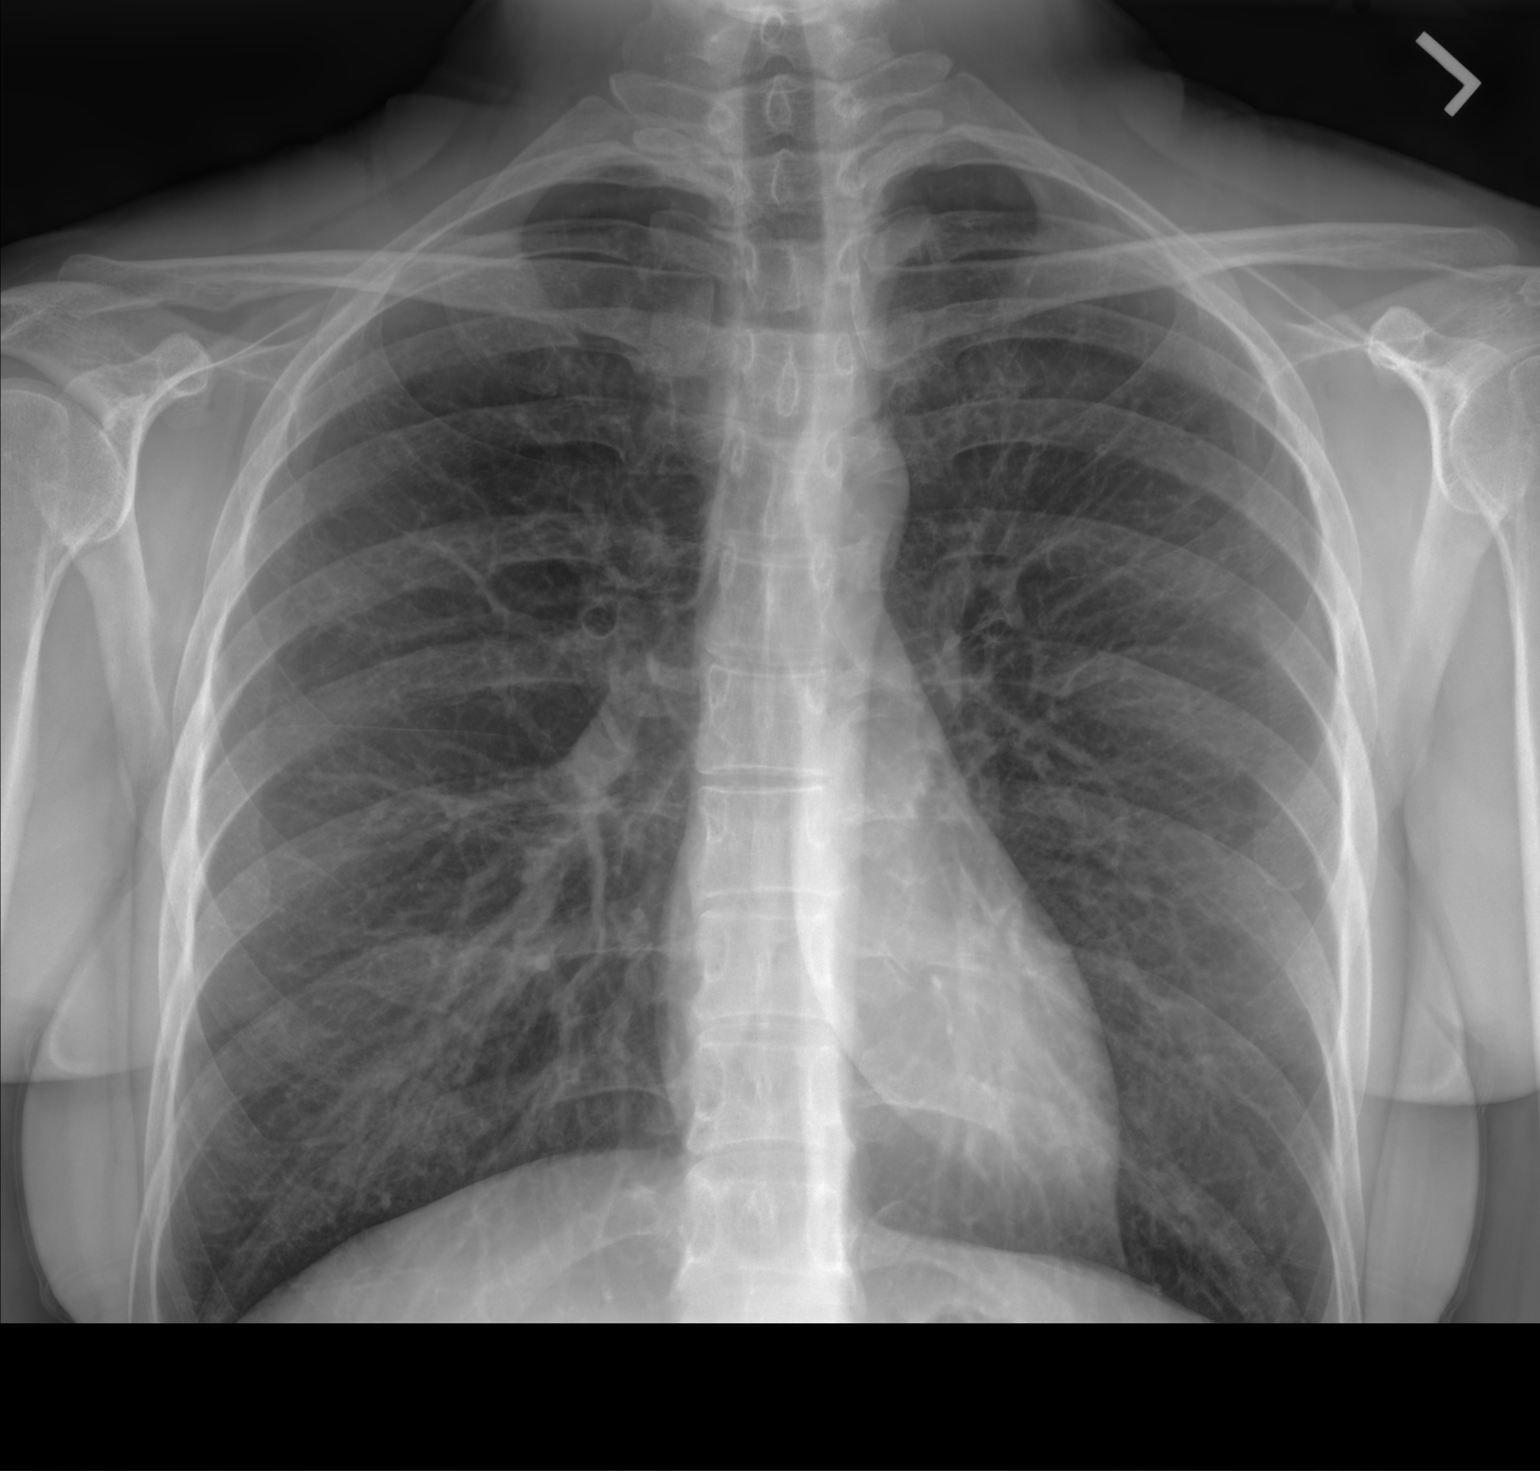

[chest lat]
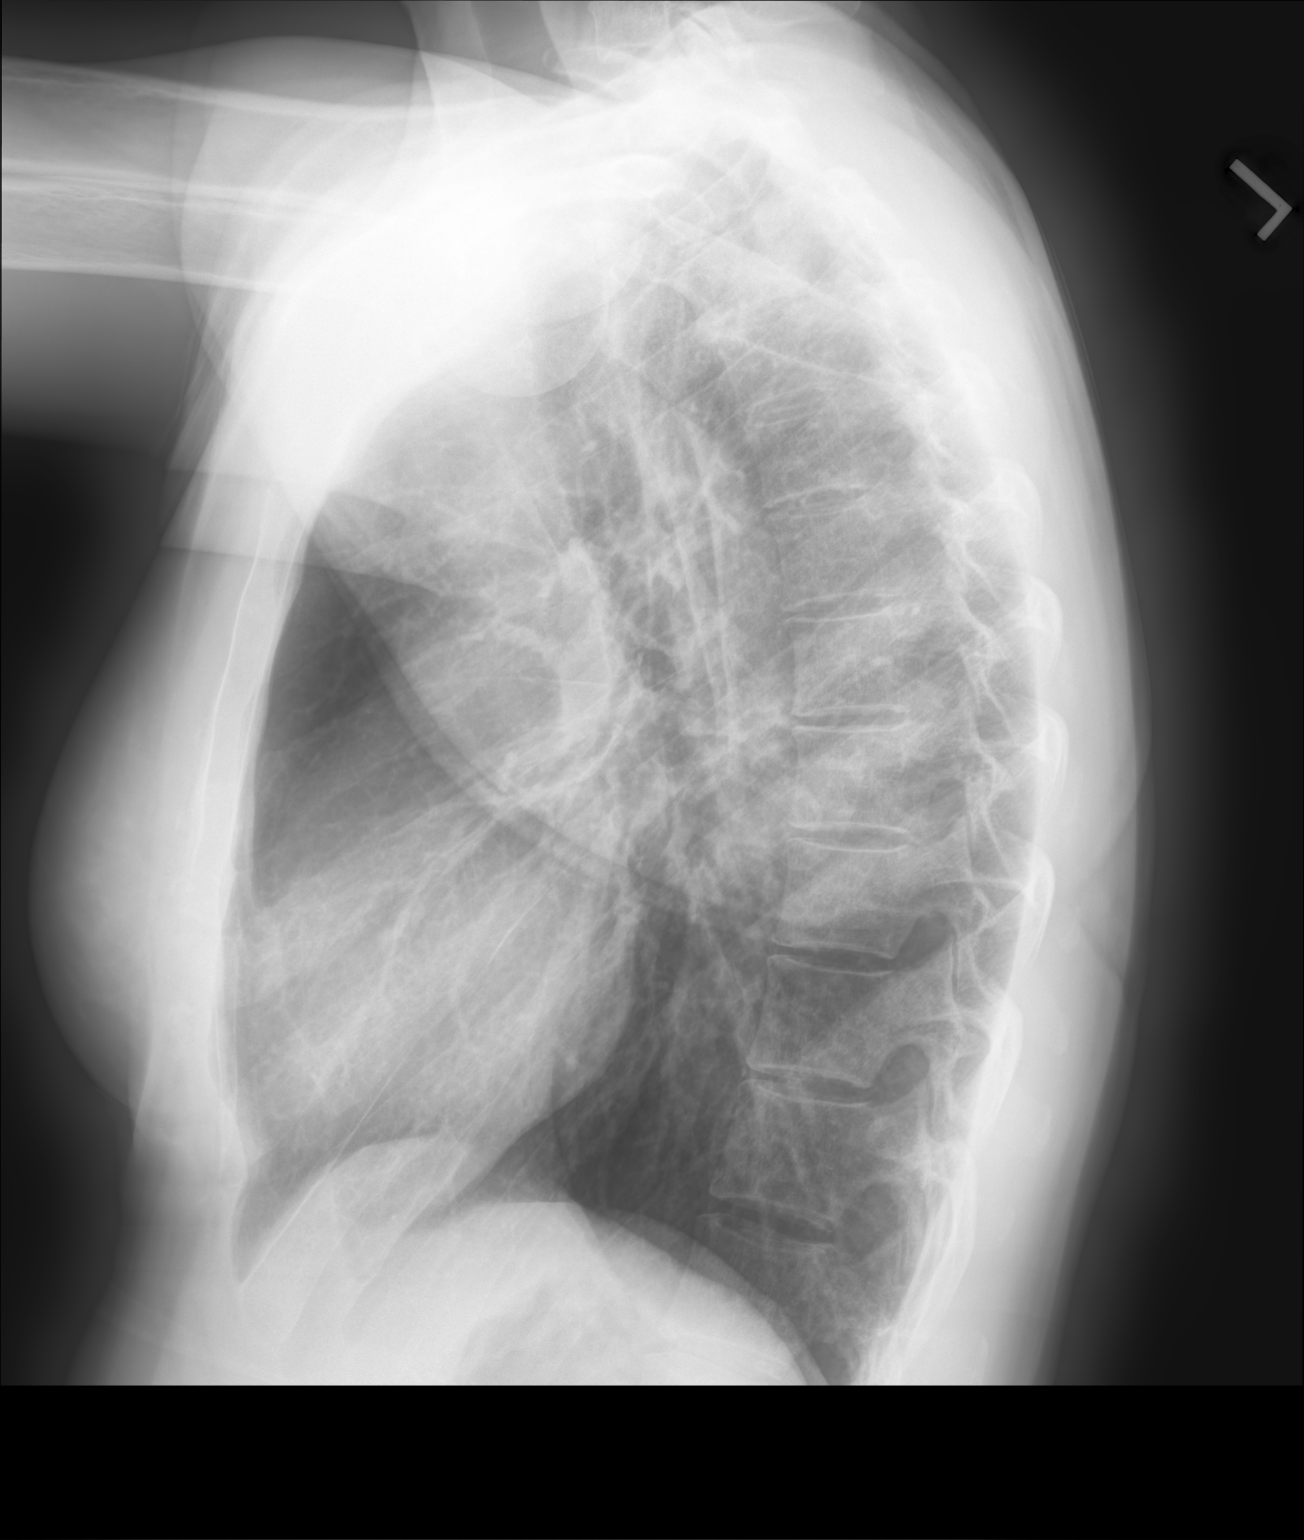

[2 of 2 positions shown; findings below may reference images not displayed]

FINDINGS: The lungs are clear. The heart size and pulmonary vascularity are
normal. No adenopathy. No bone lesions.
IMPRESSION: Lungs clear.  Cardiac silhouette normal.

## 2024-03-31 ENCOUNTER — Telehealth

## 2024-03-31 ENCOUNTER — Telehealth: Admitting: Family

## 2024-03-31 DIAGNOSIS — H109 Unspecified conjunctivitis: Secondary | ICD-10-CM | POA: Diagnosis not present

## 2024-03-31 MED ORDER — POLYMYXIN B-TRIMETHOPRIM 10000-0.1 UNIT/ML-% OP SOLN: 1.0000 [drp] | Freq: Four times a day (QID) | OPHTHALMIC | 0 refills | Status: AC

## 2024-03-31 NOTE — Progress Notes (Signed)
 We are sorry that you are not feeling well.  Here is how we plan to help!  Based on what you have shared with me it looks like you have conjunctivitis.  Conjunctivitis is a common inflammatory or infectious condition of the eye that is often referred to as pink eye.  In most cases it is contagious (viral or bacterial). However, not all conjunctivitis requires antibiotics (ex. Allergic).  We have made appropriate suggestions for you based upon your presentation.  I have prescribed Polytrim Ophthalmic drops 1-2 drops 4 times a day times 5 days  Pink eye can be highly contagious.  It is typically spread through direct contact with secretions, or contaminated objects or surfaces that one may have touched.  Strict handwashing is suggested with soap and water is urged.  If not available, use alcohol based had sanitizer.  Avoid unnecessary touching of the eye.  If you wear contact lenses, you will need to refrain from wearing them until you see no white discharge from the eye for at least 24 hours after being on medication.  You should see symptom improvement in 1-2 days after starting the medication regimen.  Call us  if symptoms are not improved in 1-2 days.  Home Care: Wash your hands often! Do not wear your contacts until you complete your treatment plan. Avoid sharing towels, bed linen, personal items with a person who has pink eye. See attention for anyone in your home with similar symptoms.  Get Help Right Away If: Your symptoms do not improve. You develop blurred or loss of vision. Your symptoms worsen (increased discharge, pain or redness)  Your e-visit answers were reviewed by a board certified advanced clinical practitioner to complete your personal care plan.  Depending on the condition, your plan could have included both over the counter or prescription medications.  If there is a problem please reply  once you have received a response from your provider.  Your safety is important to  us .  If you have drug allergies check your prescription carefully.    You can use MyChart to ask questions about today's visit, request a non-urgent call back, or ask for a work or school excuse for 24 hours related to this e-Visit. If it has been greater than 24 hours you will need to follow up with your provider, or enter a new e-Visit to address those concerns.   You will get an e-mail in the next two days asking about your experience.  I hope that your e-visit has been valuable and will speed your recovery. Thank you for using e-visits.  I have spent 5 minutes in review of e-visit questionnaire, review and updating patient chart, medical decision making and response to patient.   Bari Learn, FNP

## 2024-07-11 ENCOUNTER — Ambulatory Visit: Admitting: Internal Medicine
